# Patient Record
Sex: Male | Born: 1985 | Race: White | Hispanic: No | Marital: Single | Smoking: Never smoker
Health system: Southern US, Community
[De-identification: ages and names within clinical notes are randomized; demographics above are authoritative.]

## PROBLEM LIST (undated history)

## (undated) DIAGNOSIS — L80 Vitiligo: Secondary | ICD-10-CM

## (undated) DIAGNOSIS — R41840 Attention and concentration deficit: Secondary | ICD-10-CM

## (undated) DIAGNOSIS — F329 Major depressive disorder, single episode, unspecified: Secondary | ICD-10-CM

## (undated) HISTORY — DX: Attention and concentration deficit: R41.840

## (undated) HISTORY — DX: Vitiligo: L80

## (undated) HISTORY — PX: ADENOIDECTOMY: SUR15

## (undated) HISTORY — DX: Major depressive disorder, single episode, unspecified: F32.9

---

## 2016-04-09 ENCOUNTER — Encounter: Payer: Self-pay | Admitting: Family Medicine

## 2016-04-09 ENCOUNTER — Ambulatory Visit (INDEPENDENT_AMBULATORY_CARE_PROVIDER_SITE_OTHER): Payer: Managed Care, Other (non HMO) | Admitting: Family Medicine

## 2016-04-09 VITALS — BP 147/91 | HR 62 | Ht 75.0 in | Wt 205.0 lb

## 2016-04-09 DIAGNOSIS — F329 Major depressive disorder, single episode, unspecified: Secondary | ICD-10-CM | POA: Diagnosis not present

## 2016-04-09 DIAGNOSIS — R030 Elevated blood-pressure reading, without diagnosis of hypertension: Secondary | ICD-10-CM | POA: Insufficient documentation

## 2016-04-09 DIAGNOSIS — F909 Attention-deficit hyperactivity disorder, unspecified type: Secondary | ICD-10-CM | POA: Insufficient documentation

## 2016-04-09 DIAGNOSIS — IMO0001 Reserved for inherently not codable concepts without codable children: Secondary | ICD-10-CM

## 2016-04-09 DIAGNOSIS — F32A Depression, unspecified: Secondary | ICD-10-CM

## 2016-04-09 DIAGNOSIS — R0683 Snoring: Secondary | ICD-10-CM

## 2016-04-09 DIAGNOSIS — Z818 Family history of other mental and behavioral disorders: Secondary | ICD-10-CM | POA: Diagnosis not present

## 2016-04-09 DIAGNOSIS — R41840 Attention and concentration deficit: Secondary | ICD-10-CM

## 2016-04-09 HISTORY — DX: Depression, unspecified: F32.A

## 2016-04-09 HISTORY — DX: Attention and concentration deficit: R41.840

## 2016-04-09 NOTE — Patient Instructions (Addendum)
Thank you for coming in today. Get fasting labs soon.  Return in 1-3 months.  You should hear from ADD testing as well as Psychiatry.  You should hear from [facility] about the sleep study as well.   Call or go to the emergency room if you get worse, have trouble breathing, have chest pains, or palpitations.

## 2016-04-09 NOTE — Progress Notes (Signed)
Justin Wang is a 30 y.o. male who presents to [facility] Justin Wang: Primary Care Sports Medicine today for hypertension, depression, snoring.  Hypertension: Patient notes difficulty with attention. He notes during his entire childhood he's had difficulty attending to boring tasks. However he managed to school with good grades. 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 This is been brought to his attention by his coworkers. He notes at this point it is interfering with his ability to do his job.  Additionally patient notes depression symptoms. Over the past several years he feels little interest or pleasure in doing things, difficulty with sleep, trouble concentrating, and moving slowly. He notes these problems are very difficult. He does note significantly positive family history for bipolar disorder. His sister has been tentatively diagnosed with bipolar and it was thought that his grandmother did also have bipolar disorder. He does note at times not sleeping but not needs sleep, increased agitation and socialization, being distracted, racing thoughts, and being more confident.  Lastly he is concerned that he may have sleep apnea. He notes a strong family history for sleep apnea as well. He notes that he snores has headaches feels fatigued and has elevated blood pressure.   History reviewed. No pertinent past medical history. Past Surgical History:  Procedure Laterality Date  . ADENOIDECTOMY     Social History  Substance Use Topics  . Smoking status: Never Smoker  . Smokeless tobacco: Never Used  . Alcohol use Yes   family history includes Bipolar disorder in his paternal grandmother and sister; Diabetes in his father; Hypertension in his mother and sister; Sleep apnea in his father, mother, and sister.  ROS as above: No , visual changes,  nausea, vomiting, diarrhea, constipation, dizziness, abdominal pain, skin rash, fevers, chills, night sweats, weight loss, swollen lymph nodes, body aches, joint swelling, muscle aches, chest pain, shortness of breath,  visual or auditory hallucinations.    Medications: No current outpatient prescriptions on file.   No current facility-administered medications for this visit.    Allergies  Allergen Reactions  . Cefaclor      Exam:  BP (!) 147/91   Pulse 62   Ht 6\' 3"  (1.905 m)   Wt 205 lb (93 kg)   BMI 25.62 kg/m  Gen: Well NAD HEENT: EOMI,  MMM Lungs: Normal work of breathing. CTABL Heart: RRR no MRG Abd: NABS, Soft. Nondistended, Nontender Exts: Brisk capillary refill, warm and well perfused.  Psych: Alert and oriented normal speech thought process and affect. Mild fidgetiness noted.  Depression screen PHQ 2/9 04/09/2016  Decreased Interest 3  Down, Depressed, Hopeless 2  PHQ - 2 Score 5  Altered sleeping 3  Tired, decreased energy 2  Change in appetite 0  Feeling bad or failure about yourself  0  Trouble concentrating 3  Moving slowly or fidgety/restless 3  Suicidal thoughts 0  PHQ-9 Score 16  Difficult doing work/chores Very difficult    GAD 7 : Generalized Anxiety Score 04/09/2016  Nervous, Anxious, on Edge 1  Control/stop worrying 1  Worry too much - different things 2  Trouble relaxing 1  Restless 0  Easily annoyed or irritable 2  Afraid - awful might happen 0  Total GAD 7 Score 7  Anxiety Difficulty Very difficult    Mood disorder questionnaire positive 9 items  STOP BANG: Snore:     Yes Tired:     Yes Observed stop  breathing:  No Hypertension:   Yes  BMI >35:   No Age >50:   No Neck > 16 inches:  Yes  Male gender:   Yes ------------------------------------------ Total:     5/8   No results found for this or any previous visit (from the past 24 hour(s)). No results found.    Assessment and Plan: 30 y.o. male with   1) Poor  attention: Concerning for ADHD based on questionnaire and history. However there appears to be coexisting depression or bipolar depression. Plan to refer to [facility] for more formal testing and possible treatment options.  2) depression symptoms: Patient certainly has a positive PHQ9 however he also has a positive mood disorder questionnaire as well as a family history for bipolar depression. I'm concerned about the possibility of worsening his symptoms by starting medications today. Refer to psychiatry for more formal testing and treatment options.  3) possible sleep apnea: Patient has a strong family history of sleep apnea and screens positive with a stop bang questionnaire today. Plan refer for home sleep study.  4) elevated blood pressure: We'll recheck blood pressure in one to 3 months and check basic fasting labs today. I suspect sleep apnea may be causing elevated blood pressure.   Orders Placed This Encounter  Procedures  . CBC  . Comprehensive metabolic panel    Order Specific Question:   Has the patient fasted?    Answer:   No  . Lipid panel    Order Specific Question:   Has the patient fasted?    Answer:   No  . Hemoglobin A1c  . TSH  . VITAMIN D 25 Hydroxy (Vit-D Deficiency, Fractures)  . Ambulatory referral to Psychology    Referral Priority:   Routine    Referral Type:   Psychiatric    Referral Reason:   Specialty Services Required    Requested Specialty:   Psychology    Number of Visits Requested:   1  . Ambulatory referral to Psychiatry    Referral Priority:   Routine    Referral Type:   Psychiatric    Referral Reason:   Specialty Services Required    Requested Specialty:   Psychiatry    Number of Visits Requested:   1  . Home sleep test    Standing Status:   Future    Standing Expiration Date:   04/09/2017    Scheduling Instructions:     [facility]     STOP BANG 5/8    Order Specific Question:   Where should this test be  performed:    Answer:   Other    Discussed warning signs or symptoms. Please see discharge instructions. Patient expresses understanding.

## 2016-04-24 LAB — COMPREHENSIVE METABOLIC PANEL
ALT: 21 U/L (ref 9–46)
AST: 31 U/L (ref 10–40)
Albumin: 4.7 g/dL (ref 3.6–5.1)
Alkaline Phosphatase: 36 U/L — ABNORMAL LOW (ref 40–115)
BILIRUBIN TOTAL: 0.4 mg/dL (ref 0.2–1.2)
BUN: 23 mg/dL (ref 7–25)
CALCIUM: 9.5 mg/dL (ref 8.6–10.3)
CO2: 26 mmol/L (ref 20–31)
Chloride: 102 mmol/L (ref 98–110)
Creat: 1.23 mg/dL (ref 0.60–1.35)
GLUCOSE: 86 mg/dL (ref 65–99)
Potassium: 4.4 mmol/L (ref 3.5–5.3)
SODIUM: 137 mmol/L (ref 135–146)
Total Protein: 6.8 g/dL (ref 6.1–8.1)

## 2016-04-24 LAB — LIPID PANEL
Cholesterol: 144 mg/dL (ref 125–200)
HDL: 56 mg/dL (ref >=40)
LDL CALC: 73 mg/dL (ref <130)
Total CHOL/HDL Ratio: 2.6 Ratio (ref <=5.0)
Triglycerides: 74 mg/dL (ref <150)
VLDL: 15 mg/dL (ref <30)

## 2016-04-24 LAB — VITAMIN D 25 HYDROXY (VIT D DEFICIENCY, FRACTURES): VIT D 25 HYDROXY: 34 ng/mL (ref 30–100)

## 2016-04-24 LAB — CBC
HEMATOCRIT: 46.6 % (ref 38.5–50.0)
Hemoglobin: 15.3 g/dL (ref 13.2–17.1)
MCH: 29.8 pg (ref 27.0–33.0)
MCHC: 32.8 g/dL (ref 32.0–36.0)
MCV: 90.8 fL (ref 80.0–100.0)
MPV: 10.5 fL (ref 7.5–12.5)
Platelets: 241 10*3/uL (ref 140–400)
RBC: 5.13 MIL/uL (ref 4.20–5.80)
RDW: 13.4 % (ref 11.0–15.0)
WBC: 5.1 10*3/uL (ref 3.8–10.8)

## 2016-04-24 LAB — TSH: TSH: 2.93 mIU/L (ref 0.40–4.50)

## 2016-04-24 LAB — HEMOGLOBIN A1C
HEMOGLOBIN A1C: 5.4 % (ref <5.7)
Mean Plasma Glucose: 108 mg/dL

## 2016-05-27 ENCOUNTER — Ambulatory Visit (INDEPENDENT_AMBULATORY_CARE_PROVIDER_SITE_OTHER): Payer: 59 | Admitting: Psychiatry

## 2016-05-27 ENCOUNTER — Encounter (HOSPITAL_COMMUNITY): Payer: Self-pay | Admitting: Psychiatry

## 2016-05-27 VITALS — BP 124/80 | HR 61 | Resp 18 | Ht 73.0 in | Wt 202.0 lb

## 2016-05-27 DIAGNOSIS — F331 Major depressive disorder, recurrent, moderate: Secondary | ICD-10-CM | POA: Diagnosis not present

## 2016-05-27 DIAGNOSIS — F411 Generalized anxiety disorder: Secondary | ICD-10-CM | POA: Diagnosis not present

## 2016-05-27 MED ORDER — ESCITALOPRAM OXALATE 10 MG PO TABS: 10.0000 mg | ORAL_TABLET | Freq: Every day | ORAL | 0 refills | Status: DC

## 2016-05-27 NOTE — Progress Notes (Signed)
Psychiatric Initial Adult Assessment   Patient Identification: Justin Wang MRN:  [id number] Date of Evaluation:  05/27/2016 Referral Source: Dr. Denyse Amass Chief Complaint:   Chief Complaint    Establish Care     Visit Diagnosis:    ICD-9-CM ICD-10-CM   1. Moderate episode of recurrent major depressive disorder (HCC) 296.32 F33.1   2. GAD (generalized anxiety disorder) 300.02 F41.1     History of Present Illness:  30 years old currently single Caucasian male referred by primary care office for management or evaluation of depression  Patient states for the last 3 years progressively more gradually isn't feeling low in motivated decreased interest in his hobbies including sailing and also social inhibition meaning not wanting to go out with friends as he has been in the past or avoiding or not responding to their phone calls or text messages. States that he has been feeling low some crying spells decreased energy he zones out and does not want to be motivated to do things once he gets back from work he does like his work and is able to function but has noticed some zoning out or decreased concentration while doing multitasking Also endorses worries, excessive at times difficult work is conscious if he has to do a one-on-one with a girl but in a group setting he says he would be fine but would just sit at the back Some sleep disturbance but overall no hopelessness to the point of suicidal or homicidal thoughts. No significant guilt but does feel decreased interest in things  No psychotic symptoms, no manic symptoms currently or in the past  Aggravating factor: breakup with girl 3 years ago. It was still an anxiety provoking relationship  Modifying factor: goes to gym, work  Associated Signs/Symptoms: Depression Symptoms:  anhedonia, difficulty concentrating, anxiety, loss of energy/fatigue, disturbed sleep, (Hypo) Manic Symptoms:  Distractibility, Anxiety Symptoms:  Excessive  Worry, Psychotic Symptoms:  denies PTSD Symptoms: NA  Past Psychiatric History: Denies   Previous Psychotropic Medications: No   Substance Abuse History in the last 12 months:  No.  Consequences of Substance Abuse: NA  Past Medical History: No past medical history on file.  Past Surgical History:  Procedure Laterality Date  . ADENOIDECTOMY      Family Psychiatric History: sister: anxiety Aunt: substance use and bipolar  Family History:  Family History  Problem Relation Age of Onset  . Hypertension Mother   . Sleep apnea Mother   . Diabetes Father   . Sleep apnea Father   . Hypertension Sister   . Sleep apnea Sister   . Bipolar disorder Sister   . Bipolar disorder Paternal Grandmother     Social History:   Social History   Social History  . Marital status: Single    Spouse name: N/A  . Number of children: N/A  . Years of education: N/A   Social History Main Topics  . Smoking status: Never Smoker  . Smokeless tobacco: Never Used  . Alcohol use 1.8 - 2.4 oz/week    3 - 4 Cans of beer per week  . Drug use: No  . Sexual activity: Yes    Partners: Female    Birth control/ protection: Condom   Other Topics Concern  . None   Social History Narrative  . None    Additional Social History: Grew up with his parents he has siblings there was no trauma or physical sexual abuse he described his growing up to be good like to school he had  few friends he was active in sports. Not married, no kids  Allergies:   Allergies  Allergen Reactions  . Cefaclor     Metabolic Disorder Labs: Lab Results  Component Value Date   HGBA1C 5.4 04/23/2016   MPG 108 04/23/2016   No results found for: PROLACTIN Lab Results  Component Value Date   CHOL 144 04/23/2016   TRIG 74 04/23/2016   HDL 56 04/23/2016   CHOLHDL 2.6 04/23/2016   VLDL 15 04/23/2016   LDLCALC 73 04/23/2016     Current Medications: Current Outpatient Prescriptions  Medication Sig Dispense Refill   . escitalopram (LEXAPRO) 10 MG tablet Take 1 tablet (10 mg total) by mouth daily. 30 tablet 0   No current facility-administered medications for this visit.     Neurologic: Headache: No Seizure: No Paresthesias:No  Musculoskeletal: Strength & Muscle Tone: within normal limits Gait & Station: normal Patient leans: no lean  Psychiatric Specialty Exam: Review of Systems  Cardiovascular: Negative for chest pain.  Skin: Negative for rash.  Psychiatric/Behavioral: Positive for depression. Negative for suicidal ideas.    Blood pressure 124/80, pulse 61, resp. rate 18, height 6\' 1"  (1.854 m), weight 202 lb (91.6 kg), SpO2 98 %.Body mass index is 26.65 kg/m.  General Appearance: Casual  Eye Contact:  Fair  Speech:  Slow  Volume:  Decreased  Mood:  Dysphoric  Affect:  Congruent and Constricted  Thought Process:  Goal Directed  Orientation:  Full (Time, Place, and Person)  Thought Content:  Rumination  Suicidal Thoughts:  No  Homicidal Thoughts:  No  Memory:  Immediate;   Fair Recent;   Fair  Judgement:  Fair  Insight:  Fair  Psychomotor Activity:  Normal  Concentration:  Concentration: Fair and Attention Span: Fair  Recall:  [employer]  Fund of Knowledge:Fair  Language: Fair  Akathisia:  No  Handed:  Right  AIMS (if indicated):    Assets:  Desire for Improvement  ADL's:  Intact  Cognition: WNL  Sleep:  Fair to variable    Treatment Plan Summary: Medication management and Plan as follows  Depression possible dysthymia or major depression recurrent, mild to moderate: We'll start Lexapro 5 mg increase Intuniv and discussed various side effects If needed we continued the Wellbutrin is considering his low energy level but since he does have anxiety Lexapro may be a better choice GAD: lexapro Reviewed sleep hygiene Patient is a non smoker Denies drug use. Labs reviewed including basics and TSH are normal More than 50% time spent in counseling and coordination of care  including patient education Follow-up in 3-4 weeks or earlier if there is call 911 or report of the emergency room for any urgent concerns of suicidal thoughts   Thresa RossAKHTAR, Gayathri Futrell, MD 9/14/20172:25 PM

## 2016-06-15 ENCOUNTER — Ambulatory Visit (INDEPENDENT_AMBULATORY_CARE_PROVIDER_SITE_OTHER): Payer: 59 | Admitting: Psychiatry

## 2016-06-15 ENCOUNTER — Encounter (HOSPITAL_COMMUNITY): Payer: Self-pay | Admitting: Psychiatry

## 2016-06-15 VITALS — BP 116/64 | HR 63 | Ht 73.0 in | Wt 200.0 lb

## 2016-06-15 DIAGNOSIS — F900 Attention-deficit hyperactivity disorder, predominantly inattentive type: Secondary | ICD-10-CM | POA: Diagnosis not present

## 2016-06-15 DIAGNOSIS — F411 Generalized anxiety disorder: Secondary | ICD-10-CM | POA: Diagnosis not present

## 2016-06-15 DIAGNOSIS — F331 Major depressive disorder, recurrent, moderate: Secondary | ICD-10-CM | POA: Diagnosis not present

## 2016-06-15 MED ORDER — ESCITALOPRAM OXALATE 10 MG PO TABS: 15.0000 mg | ORAL_TABLET | Freq: Every day | ORAL | 0 refills | Status: DC

## 2016-06-15 NOTE — Progress Notes (Signed)
[facility]   Patient Identification: Justin GustKenneth Wang MRN:  [id number] Date of Evaluation:  06/15/2016 Referral Source: Dr. Denyse Amassorey Chief Complaint:   Chief Complaint    Follow-up     Visit Diagnosis:    ICD-9-CM ICD-10-CM   1. Moderate episode of recurrent major depressive disorder (HCC) 296.32 F33.1   2. GAD (generalized anxiety disorder) 300.02 F41.1   3. 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 Is not noticed significant improvement. Last visit we added Lexapro for depression he has noticed some improvement some improvement in being during group but still feels blah at times and not motivated   No psychotic symptoms, no manic symptoms currently or in the past  Aggravating factor: breakup with girl 3 years ago. It was still an anxiety provoking relationship  Modifying factor: goes to gym, work  Associated Signs/Symptoms: Depression Symptoms:  Low energy and fatigue (Hypo) Manic Symptoms:  Distractibility, Anxiety Symptoms:  Excessive Worry, ( somewhat improved ) Psychotic Symptoms:  denies PTSD Symptoms: NA  Past Psychiatric History: Denies   Previous Psychotropic Medications: No     Past Medical History: History reviewed. No pertinent past medical history.  Past Surgical History:  Procedure Laterality Date  . ADENOIDECTOMY      Family Psychiatric History: sister: anxiety Aunt: substance use and bipolar  Family History:  Family History  Problem Relation Age of Onset  . Hypertension Mother   . Sleep apnea Mother   . Diabetes Father   . Sleep apnea Father   . Hypertension Sister    . Sleep apnea Sister   . Bipolar disorder Sister   . Bipolar disorder Paternal Grandmother     Social History:   Social History   Social History  . Marital status: Single    Spouse name: N/A  . Number of children: N/A  . Years of education: N/A   Social History Main Topics  . Smoking status: Never Smoker  . Smokeless tobacco: Never Used  . Alcohol use 1.8 - 2.4 oz/week    3 - 4 Cans of beer per week  . Drug use: No  . Sexual activity: Yes    Partners: Female    Birth control/ protection: Condom   Other Topics Concern  . None   Social History Narrative  . None    Allergies:   Allergies  Allergen Reactions  . Cefaclor     Metabolic Disorder Labs: Lab Results  Component Value Date   HGBA1C 5.4 04/23/2016   MPG 108 04/23/2016   No results found for: PROLACTIN Lab Results  Component Value Date   CHOL 144 04/23/2016   TRIG 74 04/23/2016   HDL 56 04/23/2016   CHOLHDL 2.6 04/23/2016   VLDL 15 04/23/2016   LDLCALC 73 04/23/2016     Current Medications: Current Outpatient Prescriptions  Medication Sig Dispense Refill  . escitalopram (LEXAPRO) 10 MG tablet Take 1 tablet (10 mg total) by mouth daily. 30 tablet 0  . VYVANSE 20 MG capsule Take 20 mg by mouth daily.  0   No current facility-administered medications for this visit.     Neurologic: Headache: No Seizure: No Paresthesias:No  Musculoskeletal: Strength & Muscle Tone: within normal limits Gait & Station: normal Patient leans: no lean  Psychiatric Specialty Exam: Review of Systems  Cardiovascular: Negative for chest pain.  Skin: Negative for rash.  Psychiatric/Behavioral: Positive for depression. Negative for suicidal ideas.    Blood pressure 116/64, pulse 63, height 6\' 1"  (1.854 m), weight 200 lb (90.7 kg), SpO2 98 %.Body mass index is 26.39 kg/m.  General Appearance: Casual  Eye Contact:  Fair  Speech:  Slow  Volume:  Decreased  Mood:  Less dysphoric  Affect:  Congruent and  Constricted  Thought Process:  Goal Directed  Orientation:  Full (Time, Place, and Person)  Thought Content:  Rumination  Suicidal Thoughts:  No  Homicidal Thoughts:  No  Memory:  Immediate;   Fair Recent;   Fair  Judgement:  Fair  Insight:  Fair  Psychomotor Activity:  Normal  Concentration:  Concentration: Fair and Attention Span: Fair  Recall:  [employer] of Knowledge:Fair  Language: Fair  Akathisia:  No  Handed:  Right  AIMS (if indicated):    Assets:  Desire for Improvement  ADL's:  Intact  Cognition: WNL  Sleep:  Fair to variable    Treatment Plan Summary: Medication management and Plan as follows  Depression possible dysthymia or major depression recurrent, mild to moderate: We'll increase  lexapro 15mg   Consider wellbutrin if needed  Adhd: diagnosed with [place] attention center. On vyvanse now GAD: lexapro Reviewed sleep hygiene Patient is a non smoker Denies drug use. Labs reviewed including basics and TSH are normal More than 50% time spent in counseling and coordination of care including patient education Follow-up in 4 weeks or earlier if there is call 911 or report of the emergency room for any urgent concerns of suicidal thoughts Time spent: 25 minutes  Tori Dattilio, MD 10/3/201711:17 AM

## 2016-07-17 ENCOUNTER — Other Ambulatory Visit (HOSPITAL_COMMUNITY): Payer: Self-pay | Admitting: Psychiatry

## 2016-07-20 ENCOUNTER — Ambulatory Visit (HOSPITAL_COMMUNITY): Payer: Self-pay | Admitting: Psychiatry

## 2016-07-20 NOTE — Telephone Encounter (Signed)
Received faxed from [facility] requesting a refill for Lexapro. Per Dr. Gilmore LarocheAkhtar, refill is authorized for Lexapro 10mg , #45. Rx was sent to pharmacy. Pt f.u apt is schedule for 11/10. Called and  informed pt of refill status. Pt verbalizes understanding.

## 2016-07-23 ENCOUNTER — Encounter (HOSPITAL_COMMUNITY): Payer: Self-pay | Admitting: Psychiatry

## 2016-07-23 ENCOUNTER — Ambulatory Visit (INDEPENDENT_AMBULATORY_CARE_PROVIDER_SITE_OTHER): Payer: 59 | Admitting: Psychiatry

## 2016-07-23 VITALS — BP 124/70 | HR 68 | Resp 16 | Ht 73.0 in | Wt 208.0 lb

## 2016-07-23 DIAGNOSIS — Z8249 Family history of ischemic heart disease and other diseases of the circulatory system: Secondary | ICD-10-CM

## 2016-07-23 DIAGNOSIS — Z79899 Other long term (current) drug therapy: Secondary | ICD-10-CM

## 2016-07-23 DIAGNOSIS — F331 Major depressive disorder, recurrent, moderate: Secondary | ICD-10-CM | POA: Diagnosis not present

## 2016-07-23 DIAGNOSIS — Z833 Family history of diabetes mellitus: Secondary | ICD-10-CM

## 2016-07-23 DIAGNOSIS — Z8489 Family history of other specified conditions: Secondary | ICD-10-CM

## 2016-07-23 DIAGNOSIS — F411 Generalized anxiety disorder: Secondary | ICD-10-CM

## 2016-07-23 DIAGNOSIS — F900 Attention-deficit hyperactivity disorder, predominantly inattentive type: Secondary | ICD-10-CM

## 2016-07-23 DIAGNOSIS — Z818 Family history of other mental and behavioral disorders: Secondary | ICD-10-CM

## 2016-07-23 MED ORDER — ESCITALOPRAM OXALATE 10 MG PO TABS: ORAL_TABLET | ORAL | 0 refills | Status: DC

## 2016-07-23 NOTE — Progress Notes (Signed)
[facility]   Patient Identification: Beverly GustKenneth Been MRN:  [id number] Date of Evaluation:  07/23/2016 Referral Source: Dr. Denyse Amassorey Chief Complaint:   Chief Complaint    Follow-up     Visit Diagnosis:    ICD-9-CM ICD-10-CM   1. Moderate episode of recurrent major depressive disorder (HCC) 296.32 F33.1   2. GAD (generalized anxiety disorder) 300.02 F41.1   3. 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   [facility] is now [facility] and [facility] done is on Vyvanse 40 mg.  His inattention is improved.    Depression: we increased lexapro to 15mg  last visit that has helped. Better energy and motivation.  No psychotic symptoms, no manic symptoms currently or in the past  Aggravating factor: breakup with girl 3 years ago. It was still an anxiety provoking relationship  Modifying factor: goes to gym, work  Associated Signs/Symptoms: Depression Symptoms:  Energy improved. Less sad (Hypo) Manic Symptoms:  Distractibility, Anxiety Symptoms:  Excessive Worry, (improved ) Psychotic Symptoms:  denies PTSD Symptoms: NA  Past Psychiatric History: Denies   Previous Psychotropic Medications: No     Past Medical History: History reviewed. No pertinent past medical history.  Past Surgical History:  Procedure Laterality Date  . ADENOIDECTOMY      Family Psychiatric History: sister: anxiety Aunt: substance use and bipolar  Family History:  Family History  Problem Relation Age of Onset  . Hypertension Mother   . Sleep apnea Mother   . Diabetes Father   . Sleep apnea Father   . Hypertension Sister   . Sleep apnea Sister   . Bipolar disorder Sister   . Bipolar  disorder Paternal Grandmother     Social History:   Social History   Social History  . Marital status: Single    Spouse name: N/A  . Number of children: N/A  . Years of education: N/A   Social History Main Topics  . Smoking status: Never Smoker  . Smokeless tobacco: Never Used  . Alcohol use 0.6 - 1.2 oz/week    1 - 2 Cans of beer per week     Comment: weekends  . Drug use: No  . Sexual activity: Yes    Partners: Female    Birth control/ protection: Condom   Other Topics Concern  . None   Social History Narrative  . None    Allergies:   Allergies  Allergen Reactions  . Cefaclor     Metabolic Disorder Labs: Lab Results  Component Value Date   HGBA1C 5.4 04/23/2016   MPG 108 04/23/2016   No results found for: PROLACTIN Lab Results  Component Value Date   CHOL 144 04/23/2016   TRIG 74 04/23/2016   HDL 56 04/23/2016   CHOLHDL 2.6 04/23/2016   VLDL 15 04/23/2016   LDLCALC 73 04/23/2016     Current Medications: Current Outpatient Prescriptions  Medication Sig Dispense Refill  . escitalopram (LEXAPRO) 10 MG tablet TAKE 1.5 TABLETS BY MOUTH DAILY This is a 90 day supply.  Delete the prior 30 day prescription sent. 135 tablet 0  . VYVANSE 40 MG capsule Take 40 mg by mouth daily.   0   No current facility-administered medications for this visit.  Neurologic: Headache: No Seizure: No Paresthesias:No  Musculoskeletal: Strength & Muscle Tone: within normal limits Gait & Station: normal Patient leans: no lean  Psychiatric Specialty Exam: Review of Systems  Cardiovascular: Negative for palpitations.  Skin: Negative for rash.  Psychiatric/Behavioral: Negative for depression and suicidal ideas.    Blood pressure 124/70, pulse 68, resp. rate 16, height 6\' 1"  (1.854 m), weight 208 lb (94.3 kg), SpO2 98 %.Body mass index is 27.44 kg/m.  General Appearance: Casual  Eye Contact:  Fair  Speech:  Slow  Volume:  normal  Mood:  euthymic  Affect:   reactive  Thought Process:  Goal Directed  Orientation:  Full (Time, Place, and Person)  Thought Content:  Rumination  Suicidal Thoughts:  No  Homicidal Thoughts:  No  Memory:  Immediate;   Fair Recent;   Fair  Judgement:  Fair  Insight:  Fair  Psychomotor Activity:  Normal  Concentration:  Concentration: Fair and Attention Span: Fair  Recall:  [employer]Fair  Fund of Knowledge:Fair  Language: Fair  Akathisia:  No  Handed:  Right  AIMS (if indicated):    Assets:  Desire for Improvement  ADL's:  Intact  Cognition: WNL  Sleep:  Fair to variable    Treatment Plan Summary: Medication management and Plan as follows  Depression possible dysthymia or major depression recurrent, mild to moderate: Continue lexapro 15mg . Sent 90 day prescription.  Adhd: diagnosed with [place] attention center. On vyvanse now 40mg . Wants to shift this office for prescription by next month or visit.  GAD: lexapro as above. Not worsened.  Reviewed sleep hygiene Patient is a non smoker Denies drug use. Labs reviewed including basics and TSH are normal More than 50% time spent in counseling and coordination of care including patient education Follow-up in 2-3 months or earlier if there is call 911 or report of the emergency room for any urgent concerns of suicidal thoughts Time spent: 25 minutes  Thresa RossAKHTAR, Darianne Muralles, MD 11/10/201712:04 PM

## 2016-10-22 ENCOUNTER — Ambulatory Visit (HOSPITAL_COMMUNITY): Payer: Self-pay | Admitting: Psychiatry

## 2016-11-11 ENCOUNTER — Encounter: Payer: Self-pay | Admitting: Family Medicine

## 2016-11-11 ENCOUNTER — Ambulatory Visit (INDEPENDENT_AMBULATORY_CARE_PROVIDER_SITE_OTHER): Payer: Managed Care, Other (non HMO) | Admitting: Family Medicine

## 2016-11-11 VITALS — BP 134/69 | HR 64 | Wt 217.0 lb

## 2016-11-11 DIAGNOSIS — R030 Elevated blood-pressure reading, without diagnosis of hypertension: Secondary | ICD-10-CM

## 2016-11-11 DIAGNOSIS — Z23 Encounter for immunization: Secondary | ICD-10-CM

## 2016-11-11 DIAGNOSIS — Z0000 Encounter for general adult medical examination without abnormal findings: Secondary | ICD-10-CM

## 2016-11-11 DIAGNOSIS — F325 Major depressive disorder, single episode, in full remission: Secondary | ICD-10-CM | POA: Diagnosis not present

## 2016-11-11 DIAGNOSIS — R0683 Snoring: Secondary | ICD-10-CM | POA: Diagnosis not present

## 2016-11-11 DIAGNOSIS — L80 Vitiligo: Secondary | ICD-10-CM

## 2016-11-11 DIAGNOSIS — N342 Other urethritis: Secondary | ICD-10-CM

## 2016-11-11 DIAGNOSIS — R41840 Attention and concentration deficit: Secondary | ICD-10-CM

## 2016-11-11 DIAGNOSIS — Z8041 Family history of malignant neoplasm of ovary: Secondary | ICD-10-CM

## 2016-11-11 HISTORY — DX: Vitiligo: L80

## 2016-11-11 LAB — CBC
HEMATOCRIT: 42 % (ref 38.5–50.0)
HEMOGLOBIN: 14.3 g/dL (ref 13.2–17.1)
MCH: 30.7 pg (ref 27.0–33.0)
MCHC: 34 g/dL (ref 32.0–36.0)
MCV: 90.1 fL (ref 80.0–100.0)
MPV: 9.6 fL (ref 7.5–12.5)
Platelets: 260 10*3/uL (ref 140–400)
RBC: 4.66 MIL/uL (ref 4.20–5.80)
RDW: 13.6 % (ref 11.0–15.0)
WBC: 5.6 10*3/uL (ref 3.8–10.8)

## 2016-11-11 MED ORDER — LISDEXAMFETAMINE DIMESYLATE 40 MG PO CAPS: 40.0000 mg | ORAL_CAPSULE | Freq: Every day | ORAL | 0 refills | Status: DC

## 2016-11-11 MED ORDER — ESCITALOPRAM OXALATE 10 MG PO TABS: 10.0000 mg | ORAL_TABLET | Freq: Every day | ORAL | 3 refills | Status: DC

## 2016-11-11 MED ORDER — LISDEXAMFETAMINE DIMESYLATE 40 MG PO CAPS: 40.0000 mg | ORAL_CAPSULE | ORAL | 0 refills | Status: DC

## 2016-11-11 NOTE — Patient Instructions (Signed)
Thank you for coming in today. I recommend a sleep study.   You should hear from [facility] soon.  Let me know if you do not hear anything.   Continue Vyvanse and Lexapro.  I am taking over prescribing.  Recheck in 3 months.   You should hear from me soon about urine tests.    Urethritis, Adult Urethritis is an inflammation of the tube through which urine exits your bladder (urethra). What are the causes? Urethritis is often caused by an infection in your urethra. The infection can be viral, like herpes. The infection can also be bacterial, like gonorrhea. What increases the risk? Risk factors of urethritis include:  Having sex without using a condom.  Having multiple sexual partners.  Having poor hygiene. What are the signs or symptoms? Symptoms of urethritis are less noticeable in women than in men. These symptoms include:  Burning feeling when you urinate (dysuria).  Discharge from your urethra.  Blood in your urine (hematuria).  Urinating more than usual. How is this diagnosed? To confirm a diagnosis of urethritis, your health care provider will do the following:  Ask about your sexual history.  Perform a physical exam.  Have you provide a sample of your urine for lab testing.  Use a cotton swab to gently collect a sample from your urethra for lab testing. How is this treated? It is important to treat urethritis. Depending on the cause, untreated urethritis may lead to serious genital infections and possibly infertility. Urethritis caused by a bacterial infection is treated with antibiotic medicine. All sexual partners must be treated. Follow these instructions at home:  Do not have sex until the test results are known and treatment is completed, even if your symptoms go away before you finish treatment.  If you were prescribed an antibiotic, finish it all even if you start to feel better. Contact a health care provider if:  Your symptoms are not  improved in 3 days.  Your symptoms are getting worse.  You develop abdominal pain or pelvic pain (in women).  You develop joint pain.  You have a fever. Get help right away if:  You have severe pain in the belly, back, or side.  You have repeated vomiting. This information is not intended to replace advice given to you by your health care provider. Make sure you discuss any questions you have with your health care provider. Document Released: 02/23/2001 Document Revised: 02/05/2016 Document Reviewed: 04/30/2013 Elsevier Interactive Patient Education  2017 [employer]

## 2016-11-11 NOTE — Progress Notes (Signed)
Justin Wang is a 31 y.o. male who [facility]: Primary Care Sports Medicine today for well adult and discuss penile discharge.   Well Adult: Patient is doing well. He tries to exercise regularly and tries eat a healthy diet. He notes increased family and life stress recently. His mother was recently diagnosed with ovarian cancer. He does not have a regular partner but is sexually active. He has sex with women and uses condoms regularly. He feels well.  Penile discharge: Patient notes clear occasional penile discharge over the last several weeks to months. He denies significant pain or irritation fevers or chills.  ADHD: Patient is doing well Vyvanse. He has previously been [facility]my [facility]. He is on a stable dose of 40 mg of Vyvanse daily. He would like to transition his care to this location for convenience and cost.  Depression: Patient has a history of depression and currently is taking Lexapro 15 mg daily. He notes he is doing well would like to transition his care to primary care if possible. He would like to decrease his Lexapro dose to 10 mg as he thinks the 15 mg causes a headache.   Past Medical History:  Diagnosis Date  . Vitiligo 11/11/2016   Past Surgical History:  Procedure Laterality Date  . ADENOIDECTOMY     Social History  Substance Use Topics  . Smoking status: Never Smoker  . Smokeless tobacco: Never Used  . Alcohol use 0.6 - 1.2 oz/week    1 - 2 Cans of beer per week     Comment: weekends   family history includes Bipolar disorder in his paternal grandmother and sister; Diabetes in his father; Hypertension in his mother and sister; Ovarian cancer in his mother; Sleep apnea in his father, mother, and sister.  ROS as above:  Medications: Current Outpatient Prescriptions  Medication Sig Dispense Refill  . escitalopram  (LEXAPRO) 10 MG tablet Take 1 tablet (10 mg total) by mouth daily. 90 tablet 3  . lisdexamfetamine (VYVANSE) 40 MG capsule Take 1 capsule (40 mg total) by mouth daily. 30 capsule 0  . lisdexamfetamine (VYVANSE) 40 MG capsule Take 1 capsule (40 mg total) by mouth every morning. Fill in 30 days 30 capsule 0   No current facility-administered medications for this visit.    Allergies  Allergen Reactions  . Cefaclor     Health Maintenance Health Maintenance  Topic Date Due  . INFLUENZA VACCINE  05/14/2017 (Originally 04/13/2016)  . TETANUS/TDAP  11/12/2026  . HIV Screening  Completed     Exam:  BP 134/69   Pulse 64   Wt 217 lb (98.4 kg)   BMI 28.63 kg/m  Gen: Well NAD HEENT: EOMI,  MMM Lungs: Normal work of breathing. CTABL Heart: RRR no MRG Abd: NABS, Soft. Nondistended, Nontender Exts: Brisk capillary refill, warm and well perfused.  Genitals: No inguinal lymphadenopathy. Penis is circumcised and nontender with no significant discharge. Testicles are descended bilaterally with no masses Psych: Alert and oriented normal speech thought process and affect.  GAD 7 : Generalized Anxiety Score 11/11/2016 04/09/2016  Nervous, Anxious, on Edge 0 1  Control/stop worrying 0 1  Worry too much - different things 1 2  Trouble relaxing 0 1  Restless 0 0  Easily annoyed or irritable 0 2  Afraid - awful might happen 0 0  Total GAD 7 Score 1 7  Anxiety Difficulty Not difficult at  all Very difficult    Depression screen [facility] 2/9 11/11/2016 04/09/2016  Decreased Interest 0 3  Down, Depressed, Hopeless 0 2  PHQ - 2 Score 0 5  Altered sleeping 1 3  Tired, decreased energy 0 2  Change in appetite 0 0  Feeling bad or failure about yourself  0 0  Trouble concentrating 0 3  Moving slowly or fidgety/restless 1 3  Suicidal thoughts 0 0  PHQ-9 Score 2 16  Difficult doing work/chores - Very difficult      Assessment and Plan: 31 y.o. male with  Will adult. Doing reasonably well. Will obtain  basic labs listed below. Patient is it's increased risk for sleep apnea based on family history, snoring history and elevated blood pressure. We'll obtain a home sleep study that was ordered but not completed about half a year ago.  Penile discharge: Unclear etiology. Likely normal discharge however will obtain GC Chlamydia and urine culture.  ADHD: Doing well. Will take over prescribing Vyvanse. Letter written to Dr. Elisabeth Wang. Recheck in 3 months.  Depression: Doing well. Will take over prescribing Lexapro.  Basic fasting labs listed below.  Tdap given prior to discharge.  Influenza vaccine declined.   Orders Placed This Encounter  Procedures  . GC/Chlamydia Probe Amp  . Urine culture  . Tdap vaccine greater than or equal to 7yo IM  . CBC  . COMPLETE METABOLIC PANEL WITH GFR  . T3, free  . T4, free  . TSH  . HIV antibody  . RPR  . Home sleep test    Standing Status:   Future    Standing Expiration Date:   11/11/2017    Scheduling Instructions:     [facility]    Order Specific Question:   Where should this test be performed:    Answer:   Other   Meds ordered this encounter  Medications  . lisdexamfetamine (VYVANSE) 40 MG capsule    Sig: Take 1 capsule (40 mg total) by mouth daily.    Dispense:  30 capsule    Refill:  0    Fill in 60 days  . lisdexamfetamine (VYVANSE) 40 MG capsule    Sig: Take 1 capsule (40 mg total) by mouth every morning. Fill in 30 days    Dispense:  30 capsule    Refill:  0  . escitalopram (LEXAPRO) 10 MG tablet    Sig: Take 1 tablet (10 mg total) by mouth daily.    Dispense:  90 tablet    Refill:  3     Discussed warning signs or symptoms. Please see discharge instructions. Patient expresses understanding.

## 2016-11-12 LAB — COMPLETE METABOLIC PANEL WITH GFR
ALBUMIN: 4.5 g/dL (ref 3.6–5.1)
ALK PHOS: 39 U/L — AB (ref 40–115)
ALT: 34 U/L (ref 9–46)
AST: 23 U/L (ref 10–40)
BUN: 21 mg/dL (ref 7–25)
CALCIUM: 9.4 mg/dL (ref 8.6–10.3)
CO2: 27 mmol/L (ref 20–31)
Chloride: 102 mmol/L (ref 98–110)
Creat: 1.14 mg/dL (ref 0.60–1.35)
GFR, EST NON AFRICAN AMERICAN: 86 mL/min (ref >=60)
GFR, Est African American: >89 mL/min (ref >=60)
GLUCOSE: 91 mg/dL (ref 65–99)
POTASSIUM: 4.2 mmol/L (ref 3.5–5.3)
SODIUM: 137 mmol/L (ref 135–146)
TOTAL PROTEIN: 6.9 g/dL (ref 6.1–8.1)
Total Bilirubin: 0.4 mg/dL (ref 0.2–1.2)

## 2016-11-12 LAB — HIV ANTIBODY (ROUTINE TESTING W REFLEX): HIV 1&2 Ab, 4th Generation: NONREACTIVE

## 2016-11-12 LAB — GC/CHLAMYDIA PROBE AMP
CT Probe RNA: NOT DETECTED
GC PROBE AMP APTIMA: NOT DETECTED

## 2016-11-12 LAB — RPR

## 2016-11-12 LAB — T3, FREE: T3 FREE: 3.5 pg/mL (ref 2.3–4.2)

## 2016-11-12 LAB — T4, FREE: Free T4: 0.9 ng/dL (ref 0.8–1.8)

## 2016-11-12 LAB — TSH: TSH: 2.68 m[IU]/L (ref 0.40–4.50)

## 2016-11-13 LAB — URINE CULTURE: Organism ID, Bacteria: NO GROWTH

## 2017-03-10 ENCOUNTER — Ambulatory Visit (INDEPENDENT_AMBULATORY_CARE_PROVIDER_SITE_OTHER): Payer: Managed Care, Other (non HMO) | Admitting: Family Medicine

## 2017-03-10 ENCOUNTER — Encounter: Payer: Self-pay | Admitting: Family Medicine

## 2017-03-10 VITALS — BP 141/75 | HR 66 | Ht 74.0 in | Wt 221.0 lb

## 2017-03-10 DIAGNOSIS — R41840 Attention and concentration deficit: Secondary | ICD-10-CM

## 2017-03-10 MED ORDER — LISDEXAMFETAMINE DIMESYLATE 40 MG PO CAPS: 40.0000 mg | ORAL_CAPSULE | ORAL | 0 refills | Status: DC

## 2017-03-10 NOTE — Patient Instructions (Signed)
Thank you for coming in today. Take vyvanse daily.  Recheck every 3 months.   Return sooner if needed.

## 2017-03-10 NOTE — Progress Notes (Signed)
       Justin Wang is a 31 y.o. male who [name]: Primary Care Sports Medicine today for follow up ADHD. Ramos take Vyvanse 40mg  daily. He notes that this medicine works well for him. He does not sweating on this medicine that he does not find it very obnoxious. He feels well and like to continue the medications.   Past Medical History:  Diagnosis Date  . Vitiligo 11/11/2016   Past Surgical History:  Procedure Laterality Date  . ADENOIDECTOMY     Social History  Substance Use Topics  . Smoking status: Never Smoker  . Smokeless tobacco: Never Used  . Alcohol use 0.6 - 1.2 oz/week    1 - 2 Cans of beer per week     Comment: weekends   family history includes Bipolar disorder in his paternal grandmother and sister; Diabetes in his father; Hypertension in his mother and sister; Ovarian cancer in his mother; Sleep apnea in his father, mother, and sister.  ROS as above:  Medications: Current Outpatient Prescriptions  Medication Sig Dispense Refill  . escitalopram (LEXAPRO) 10 MG tablet Take 1 tablet (10 mg total) by mouth daily. 90 tablet 3  . lisdexamfetamine (VYVANSE) 40 MG capsule Take 1 capsule (40 mg total) by mouth every morning. Fill in 30 days 90 capsule 0   No current facility-administered medications for this visit.    Allergies  Allergen Reactions  . Cefaclor     Health Maintenance Health Maintenance  Topic Date Due  . INFLUENZA VACCINE  05/14/2017 (Originally 04/13/2017)  . TETANUS/TDAP  11/12/2026  . HIV Screening  Completed     Exam:  BP (!) 141/75   Pulse 66   Ht 6\' 2"  (1.88 m)   Wt 221 lb (100.2 kg)   BMI 28.37 kg/m  Gen: Well NAD Psych alert and oriented normal process and affect.   No results found for this or any previous visit (from the past 72 hour(s)). No results found.    Assessment and Plan: 31 y.o. male with  ADHD. Doing well continue  Vyvanse. Continue to monitor blood pressure. Recheck in 3 months.   No orders of the defined types were placed in this encounter.  Meds ordered this encounter  Medications  . lisdexamfetamine (VYVANSE) 40 MG capsule    Sig: Take 1 capsule (40 mg total) by mouth every morning. Fill in 30 days    Dispense:  90 capsule    Refill:  0     Discussed warning signs or symptoms. Please see discharge instructions. Patient expresses understanding.

## 2017-06-09 ENCOUNTER — Ambulatory Visit: Payer: Self-pay | Admitting: Family Medicine

## 2017-06-30 ENCOUNTER — Ambulatory Visit: Payer: Self-pay | Admitting: Family Medicine

## 2017-07-13 ENCOUNTER — Ambulatory Visit (INDEPENDENT_AMBULATORY_CARE_PROVIDER_SITE_OTHER): Payer: Managed Care, Other (non HMO) | Admitting: Family Medicine

## 2017-07-13 ENCOUNTER — Encounter: Payer: Self-pay | Admitting: Family Medicine

## 2017-07-13 VITALS — BP 132/77 | HR 87 | Wt 220.0 lb

## 2017-07-13 DIAGNOSIS — R41840 Attention and concentration deficit: Secondary | ICD-10-CM

## 2017-07-13 DIAGNOSIS — R209 Unspecified disturbances of skin sensation: Secondary | ICD-10-CM

## 2017-07-13 MED ORDER — LISDEXAMFETAMINE DIMESYLATE 40 MG PO CAPS: 40.0000 mg | ORAL_CAPSULE | ORAL | 0 refills | Status: DC

## 2017-07-13 NOTE — Progress Notes (Signed)
       Justin Wang is a 31 y.o. male who [name]: Primary Care Sports Medicine today for follow-up ADHD and discuss hands and feet being cold.  [name] takes 40 mg of Vyvanse daily for this works well and allows him to function at work.  He notes some mild excessive sweating with this but overall is quite satisfied with how things are going.  He would like to continue to take the medication if possible.  He does note however his hands and feet get cold and numb at times.  This is been ongoing since before he took Vyvanse and seems to be more cold sensitive.  He feels well otherwise and denies any pain or loss of function.   Past Medical History:  Diagnosis Date  . Vitiligo 11/11/2016   Past Surgical History:  Procedure Laterality Date  . ADENOIDECTOMY     Social History  Substance Use Topics  . Smoking status: Never Smoker  . Smokeless tobacco: Never Used  . Alcohol use 0.6 - 1.2 oz/week    1 - 2 Cans of beer per week     Comment: weekends   family history includes Bipolar disorder in his paternal grandmother and sister; Diabetes in his father; Hypertension in his mother and sister; Ovarian cancer in his mother; Sleep apnea in his father, mother, and sister.  ROS as above:  Medications: Current Outpatient Prescriptions  Medication Sig Dispense Refill  . escitalopram (LEXAPRO) 10 MG tablet Take 1 tablet (10 mg total) by mouth daily. 90 tablet 3  . lisdexamfetamine (VYVANSE) 40 MG capsule Take 1 capsule (40 mg total) by mouth every morning. 90 capsule 0   No current facility-administered medications for this visit.    Allergies  Allergen Reactions  . Cefaclor     Health Maintenance Health Maintenance  Topic Date Due  . INFLUENZA VACCINE  07/13/2018 (Originally 04/13/2017)  . TETANUS/TDAP  11/12/2026  . HIV Screening  Completed     Exam:  BP 132/77   Pulse 87   Wt 220  lb (99.8 kg)   BMI 28.25 kg/m  Gen: Well NAD HEENT: EOMI,  MMM Lungs: Normal work of breathing. CTABL Heart: RRR no MRG Abd: NABS, Soft. Nondistended, Nontender Exts: Brisk capillary refill, warm and well perfused.  Feet bilaterally are normal-appearing with normal pulses capillary refill and sensation.  Sensation is intact to monofilament testing.   No results found for this or any previous visit (from the past 72 hour(s)). No results found.    Assessment and Plan: 31 y.o. male with  ADHD: Doing well.  Continue Vyvanse and recheck in 3-4 months when he runs out.  Hands and feet cold: Unclear etiology.  I do not think he has Raynauds phenomenon or any other serious issue.  Pulses capillary refill and sensation are intact. Plan for watchful waiting.  Flu vaccine declined  No orders of the defined types were placed in this encounter.  Meds ordered this encounter  Medications  . lisdexamfetamine (VYVANSE) 40 MG capsule    Sig: Take 1 capsule (40 mg total) by mouth every morning.    Dispense:  90 capsule    Refill:  0     Discussed warning signs or symptoms. Please see discharge instructions. Patient expresses understanding.

## 2017-07-13 NOTE — Patient Instructions (Addendum)
Thank you for coming in today. Continue vyvanse.  Recheck in 3-4 months.  Return before you run out.   Let me know about hands being cold.

## 2017-11-16 ENCOUNTER — Encounter: Payer: Self-pay | Admitting: Family Medicine

## 2017-11-16 ENCOUNTER — Ambulatory Visit (INDEPENDENT_AMBULATORY_CARE_PROVIDER_SITE_OTHER): Payer: Managed Care, Other (non HMO) | Admitting: Family Medicine

## 2017-11-16 VITALS — BP 133/89 | HR 71 | Ht 74.0 in | Wt 221.0 lb

## 2017-11-16 DIAGNOSIS — Z0000 Encounter for general adult medical examination without abnormal findings: Secondary | ICD-10-CM | POA: Diagnosis not present

## 2017-11-16 DIAGNOSIS — R0683 Snoring: Secondary | ICD-10-CM

## 2017-11-16 DIAGNOSIS — Z6828 Body mass index (BMI) 28.0-28.9, adult: Secondary | ICD-10-CM | POA: Diagnosis not present

## 2017-11-16 DIAGNOSIS — R41840 Attention and concentration deficit: Secondary | ICD-10-CM | POA: Diagnosis not present

## 2017-11-16 DIAGNOSIS — R030 Elevated blood-pressure reading, without diagnosis of hypertension: Secondary | ICD-10-CM | POA: Diagnosis not present

## 2017-11-16 LAB — CBC
HCT: 45.2 % (ref 38.5–50.0)
HEMOGLOBIN: 15.5 g/dL (ref 13.2–17.1)
MCH: 30.1 pg (ref 27.0–33.0)
MCHC: 34.3 g/dL (ref 32.0–36.0)
MCV: 87.8 fL (ref 80.0–100.0)
MPV: 9.8 fL (ref 7.5–12.5)
PLATELETS: 257 10*3/uL (ref 140–400)
RBC: 5.15 10*6/uL (ref 4.20–5.80)
RDW: 12.8 % (ref 11.0–15.0)
WBC: 5.3 10*3/uL (ref 3.8–10.8)

## 2017-11-16 LAB — COMPLETE METABOLIC PANEL WITH GFR
AG Ratio: 1.9 (calc) (ref 1.0–2.5)
ALKALINE PHOSPHATASE (APISO): 43 U/L (ref 40–115)
ALT: 20 U/L (ref 9–46)
AST: 23 U/L (ref 10–40)
Albumin: 4.8 g/dL (ref 3.6–5.1)
BILIRUBIN TOTAL: 0.6 mg/dL (ref 0.2–1.2)
BUN: 18 mg/dL (ref 7–25)
CHLORIDE: 101 mmol/L (ref 98–110)
CO2: 31 mmol/L (ref 20–32)
CREATININE: 1.12 mg/dL (ref 0.60–1.35)
Calcium: 9.7 mg/dL (ref 8.6–10.3)
GFR, Est African American: 101 mL/min/{1.73_m2} (ref >=60)
GFR, Est Non African American: 87 mL/min/{1.73_m2} (ref >=60)
GLOBULIN: 2.5 g/dL (ref 1.9–3.7)
Glucose, Bld: 94 mg/dL (ref 65–99)
Potassium: 4.4 mmol/L (ref 3.5–5.3)
SODIUM: 138 mmol/L (ref 135–146)
Total Protein: 7.3 g/dL (ref 6.1–8.1)

## 2017-11-16 LAB — LIPID PANEL W/REFLEX DIRECT LDL
Cholesterol: 178 mg/dL (ref <200)
HDL: 48 mg/dL (ref >40)
LDL Cholesterol (Calc): 114 mg/dL (calc) — ABNORMAL HIGH
Non-HDL Cholesterol (Calc): 130 mg/dL (calc) — ABNORMAL HIGH (ref <130)
Total CHOL/HDL Ratio: 3.7 (calc) (ref <5.0)
Triglycerides: 68 mg/dL (ref <150)

## 2017-11-16 MED ORDER — AMPHETAMINE-DEXTROAMPHET ER 20 MG PO CP24: 20.0000 mg | ORAL_CAPSULE | Freq: Every day | ORAL | 0 refills | Status: DC

## 2017-11-16 NOTE — Progress Notes (Signed)
Justin Wang is a 32 y.o. male who presents to [name]: Primary Care Sports Medicine today for well adult visit and discuss ADHD and depression.  [name] is a healthy 32 year old man who has ADHD that is currently managed with Vyvanse.  He is doing well overall and notes that he is exercising regularly and feeling pretty good about his life.  He tries to eat healthfully and get adequate sleep.  As for his ADHD he notes that his insurance will no longer pay for Vyvanse and he needs to switch to an alternative medication.  As for depression he did well after his diagnosis of depression and treatment with Lexapro.  He has successfully tapered off of Lexapro and is no longer feeling any depressive symptoms.  He notes a history of elevated blood pressure and notes that he snores and is interested in getting a sleep study that we talked about a few visits ago.   Past Medical History:  Diagnosis Date  . Attention deficit 04/09/2016  . Depression 04/09/2016  . Vitiligo 11/11/2016   Past Surgical History:  Procedure Laterality Date  . ADENOIDECTOMY     Social History   Tobacco Use  . Smoking status: Never Smoker  . Smokeless tobacco: Never Used  Substance Use Topics  . Alcohol use: Yes    Alcohol/week: 0.6 - 1.2 oz    Types: 1 - 2 Cans of beer per week    Comment: weekends   family history includes Bipolar disorder in his paternal grandmother and sister; Diabetes in his father; Hypertension in his mother and sister; Ovarian cancer in his mother; Sleep apnea in his father, mother, and sister.  ROS as above:  Medications: Current Outpatient Medications  Medication Sig Dispense Refill  . cetirizine (ZYRTEC) 10 MG tablet Take 10 mg by mouth daily.    Marland Kitchen amphetamine-dextroamphetamine (ADDERALL XR) 20 MG 24 hr capsule Take 1 capsule (20 mg total) by mouth daily. 30 capsule 0   No current  facility-administered medications for this visit.    Allergies  Allergen Reactions  . Cefaclor     Health Maintenance Health Maintenance  Topic Date Due  . INFLUENZA VACCINE  07/13/2018 (Originally 04/13/2017)  . TETANUS/TDAP  11/12/2026  . HIV Screening  Completed     Exam:  BP 133/89   Pulse 71   Ht 6\' 2"  (1.88 m)   Wt 221 lb (100.2 kg)   BMI 28.37 kg/m  Gen: Well NAD HEENT: EOMI,  MMM Lungs: Normal work of breathing. CTABL Heart: RRR no MRG Abd: NABS, Soft. Nondistended, Nontender Exts: Brisk capillary refill, warm and well perfused.  No dysplastic appearing nevi Psych alert and oriented normal speech thought process and affect.  Depression screen [facility] 2/9 11/16/2017 11/11/2016 04/09/2016  Decreased Interest 0 0 3  Down, Depressed, Hopeless 0 0 2  PHQ - 2 Score 0 0 5  Altered sleeping 0 1 3  Tired, decreased energy 0 0 2  Change in appetite 0 0 0  Feeling bad or failure about yourself  0 0 0  Trouble concentrating 0 0 3  Moving slowly or fidgety/restless 0 1 3  Suicidal thoughts 0 0 0  PHQ-9 Score 0 2 16  Difficult doing work/chores Not difficult at all - Very difficult   GAD 7 : Generalized Anxiety Score 11/16/2017 11/11/2016 04/09/2016  Nervous, Anxious, on Edge 0 0 1  Control/stop worrying 0 0 1  Worry too much - different things 0  1 2  Trouble relaxing 2 0 1  Restless 1 0 0  Easily annoyed or irritable 0 0 2  Afraid - awful might happen 0 0 0  Total GAD 7 Score 3 1 7   Anxiety Difficulty Not difficult at all Not difficult at all Very difficult      No results found for this or any previous visit (from the past 72 hour(s)). No results found.    Assessment and Plan: 32 y.o. male with well adult doing well.  Plan to check basic fasting labs listed below.  ADHD: We will switch to Adderall trial.  Patient will report back in a few weeks to let me know how he is doing.  If all is well continue for 3 months.  Check in 3 months  Depression: Resolved.  Plan for  watchful waiting off of medication.  Possible sleep apnea: Sleep study ordered.  Elevated blood pressure not diagnostic of hypertension.  Watchful waiting.  Continue exercise and keep the weight off.  Evaluate for sleep apnea.  Check metabolic panel.   Orders Placed This Encounter  Procedures  . CBC  . COMPLETE METABOLIC PANEL WITH GFR  . Lipid Panel w/reflex Direct LDL  . Home sleep test    Standing Status:   Future    Standing Expiration Date:   11/17/2018    Scheduling Instructions:     Sound sleep    Order Specific Question:   Where should this test be performed:    Answer:   Other   Meds ordered this encounter  Medications  . amphetamine-dextroamphetamine (ADDERALL XR) 20 MG 24 hr capsule    Sig: Take 1 capsule (20 mg total) by mouth daily.    Dispense:  30 capsule    Refill:  0     Discussed warning signs or symptoms. Please see discharge instructions. Patient expresses understanding.

## 2017-11-16 NOTE — Patient Instructions (Addendum)
Thank you for coming in today. Continue Zyrtec You should hear about sleep study soon.  Let me know if you don't hear anything within 1 week.  Ok to have tapered off lexapro.  Let me know if we need to restart.  Switch to Adderall Xr for insurance.  If Xr is not covered we will switch to immediate release.   Recheck with me every 3 months ad per usually.  Send me a report in 2-4 weeks through mychart about Adderall  We can adjust.   Amphetamine; Dextroamphetamine extended-release capsules What is this medicine? AMPHETAMINE; DEXTROAMPHETAMINE (am FET a meen; dex troe am FET a meen) is used to treat attention-deficit hyperactivity disorder (ADHD). Federal law prohibits giving this medicine to any person other than the person for whom it was prescribed. Do not share this medicine with anyone else. This medicine may be used for other purposes; ask your health care provider or pharmacist if you have questions. COMMON BRAND NAME(S): Adderall XR, Mydayis What should I tell my health care provider before I take this medicine? They need to know if you have any of these conditions: -anxiety or panic attacks -circulation problems in fingers and toes -glaucoma -hardening or blockages of the arteries or heart blood vessels -heart disease or a heart defect -high blood pressure -history of a drug or alcohol abuse problem -history of stroke -kidney disease -liver disease -mental illness -seizures -suicidal thoughts, plans, or attempt; a previous suicide attempt by you or a family member -thyroid disease -Tourette's syndrome -an unusual or allergic reaction to dextroamphetamine, other amphetamines, other medicines, foods, dyes, or preservatives -pregnant or trying to get pregnant -breast-feeding How should I use this medicine? Take this medicine by mouth with a glass of water. Follow the directions on the prescription label. This medicine is taken just one time per day, usually in the morning  after waking up. Take with or without food. Do not chew or crush this medicine. You may open the capsules and sprinkle the medicine on a spoonful of applesauce. If sprinkled on applesauce, take the dose immediately and do not crush or chew. Always drink a glass of water or other liquid after taking this medicine. Do not take your medicine more often than directed. A special MedGuide will be given to you by the pharmacist with each prescription and refill. Be sure to read this information carefully each time. Talk to your pediatrician regarding the use of this medicine in children. While this drug may be prescribed for children as young as 6 years for selected conditions, precautions do apply. Overdosage: If you think you have taken too much of this medicine contact a poison control center or emergency room at once. NOTE: This medicine is only for you. Do not share this medicine with others. What if I miss a dose? If you miss a dose, take it as soon as you can. If it is almost time for your next dose, take only that dose. Do not take double or extra doses. What may interact with this medicine? Do not take this medicine with any of the following medications: -MAOIs like Carbex, Eldepryl, Marplan, Nardil, and Parnate -other stimulant medicines for attention disorders, weight loss, or to stay awake This medicine may also interact with the following medications: -acetazolamide -ammonium chloride -antacids -ascorbic acid -atomoxetine -caffeine -certain medicines for blood pressure -certain medicines for depression, anxiety, or psychotic disturbances -certain medicines for diabetes -certain medicines for seizures like carbamazepine, phenobarbital, phenytoin -certain medicines for stomach problems like  cimetidine, famotidine, omeprazole, lansoprazole -cold or allergy medicines -glutamic acid -lithium -meperidine -methenamine; sodium acid phosphate -narcotic medicines for  pain -norepinephrine -phenothiazines like chlorpromazine, mesoridazine, prochlorperazine, thioridazine -sodium bicarbonate This list may not describe all possible interactions. Give your health care provider a list of all the medicines, herbs, non-prescription drugs, or dietary supplements you use. Also tell them if you smoke, drink alcohol, or use illegal drugs. Some items may interact with your medicine. What should I watch for while using this medicine? Visit your doctor or health care professional for regular checks on your progress. This prescription requires that you follow special procedures with your doctor and pharmacy. You will need to have a new written prescription from your doctor every time you need a refill. This medicine may affect your concentration, or hide signs of tiredness. Until you know how this medicine affects you, do not drive, ride a bicycle, use machinery, or do anything that needs mental alertness. Tell your doctor or health care professional if this medicine loses its effects, or if you feel you need to take more than the prescribed amount. Do not change the dosage without talking to your doctor or health care professional. Decreased appetite is a common side effect when starting this medicine. Eating small, frequent meals or snacks can help. Talk to your doctor if you continue to have poor eating habits. Height and weight growth of a child taking this medicine will be monitored closely. Do not take this medicine close to bedtime. It may prevent you from sleeping. If you are going to need surgery, an MRI, a CT scan, or other procedure, tell your doctor that you are taking this medicine. You may need to stop taking this medicine before the procedure. Tell your doctor or healthcare professional right away if you notice unexplained wounds on your fingers and toes while taking this medicine. You should also tell your healthcare provider if you experience numbness or pain,  changes in the skin color, or sensitivity to temperature in your fingers or toes. What side effects may I notice from receiving this medicine? Side effects that you should report to your doctor or health care professional as soon as possible: -allergic reactions like skin rash, itching or hives, swelling of the face, lips, or tongue -changes in vision -chest pain or chest tightness -confusion, trouble speaking or understanding -fast, irregular heartbeat -fingers or toes feel numb, cool, painful -hallucination, loss of contact with reality -high blood pressure -males: prolonged or painful erection -seizures -severe headaches -shortness of breath -suicidal thoughts or other mood changes -trouble walking, dizziness, loss of balance or coordination -uncontrollable head, mouth, neck, arm, or leg movements Side effects that usually do not require medical attention (report to your doctor or health care professional if they continue or are bothersome): -anxious -headache -loss of appetite -nausea, vomiting -trouble sleeping -weight loss This list may not describe all possible side effects. Call your doctor for medical advice about side effects. You may report side effects to FDA at 1-800-FDA-1088. Where should I keep my medicine? Keep out of the reach of children. This medicine can be abused. Keep your medicine in a safe place to protect it from theft. Do not share this medicine with anyone. Selling or giving away this medicine is dangerous and against the law. Store at room temperature between 15 and 30 degrees C (59 and 86 degrees F). Keep container tightly closed. Protect from light. Throw away any unused medicine after the expiration date. NOTE: This sheet is a  summary. It may not cover all possible information. If you have questions about this medicine, talk to your doctor, pharmacist, or health care provider.  2018 Elsevier/Gold Standard (2014-07-03 18:22:45)

## 2017-11-20 ENCOUNTER — Other Ambulatory Visit: Payer: Self-pay | Admitting: Family Medicine

## 2018-02-22 ENCOUNTER — Ambulatory Visit: Payer: Self-pay | Admitting: Family Medicine

## 2019-04-19 ENCOUNTER — Encounter: Payer: Self-pay | Admitting: Family Medicine

## 2019-04-19 ENCOUNTER — Other Ambulatory Visit: Payer: Self-pay

## 2019-04-19 ENCOUNTER — Ambulatory Visit (INDEPENDENT_AMBULATORY_CARE_PROVIDER_SITE_OTHER): Payer: Managed Care, Other (non HMO) | Admitting: Family Medicine

## 2019-04-19 VITALS — BP 125/85 | HR 63 | Temp 97.7°F | Wt 222.0 lb

## 2019-04-19 DIAGNOSIS — L80 Vitiligo: Secondary | ICD-10-CM | POA: Diagnosis not present

## 2019-04-19 DIAGNOSIS — F900 Attention-deficit hyperactivity disorder, predominantly inattentive type: Secondary | ICD-10-CM | POA: Diagnosis not present

## 2019-04-19 DIAGNOSIS — R030 Elevated blood-pressure reading, without diagnosis of hypertension: Secondary | ICD-10-CM | POA: Diagnosis not present

## 2019-04-19 DIAGNOSIS — Z6828 Body mass index (BMI) 28.0-28.9, adult: Secondary | ICD-10-CM

## 2019-04-19 DIAGNOSIS — Z0000 Encounter for general adult medical examination without abnormal findings: Secondary | ICD-10-CM

## 2019-04-19 NOTE — Patient Instructions (Signed)
Thank you for coming in today. Get labs.  Pay attention to work setup at home.  Especially phone use and computer and keyboard location.  Do some home exercises.  If not improved let me know and I can order PT.   Recheck yearly.  I recommend you get a flu vaccine this fall.  OK to schedule nurse visit for mid April for flu shot here or get one at your doctor at work or a pharmacy.

## 2019-04-19 NOTE — Progress Notes (Signed)
Justin Wang is a 33 y.o. male who presents to [facility] today for well adult visit.   Justin Wang is doing well.  He is happy with how things are going.  He is working from home now with COVID-19.  He has ADHD which is managed with a psychiatry nurse practitioner closer to where he works in [place].  He notes he had a rough transition to working from home but has figured out now and is pretty happy with how things are going now.  He takes Adderall which helps a lot and does not cause obnoxious side effects including insomnia.  He has vitiligo but notes has not changed much and is happy with that.  Additionally he denies any depression or anxiety symptoms.  He is done to get some exercise by running and cycling.  He notes a little bit of left-sided neck pain starting from work from home.  He is adjusted his work set up to have a monitor at eye level which has helped some.   ROS as above:  Past Medical History:  Diagnosis Date  . Attention deficit 04/09/2016  . Depression 04/09/2016  . Vitiligo 11/11/2016   Past Surgical History:  Procedure Laterality Date  . ADENOIDECTOMY     Social History   Tobacco Use  . Smoking status: Never Smoker  . Smokeless tobacco: Never Used  Substance Use Topics  . Alcohol use: Yes    Alcohol/week: 1.0 - 2.0 standard drinks    Types: 1 - 2 Cans of beer per week    Comment: weekends   family history includes Bipolar disorder in his paternal grandmother and sister; Diabetes in his father; Hypertension in his mother and sister; Ovarian cancer in his mother; Sleep apnea in his father, mother, and sister.  Medications: Current Outpatient Medications  Medication Sig Dispense Refill  . amphetamine-dextroamphetamine (ADDERALL XR) 20 MG 24 hr capsule Take 1 capsule (20 mg total) by mouth daily. 30 capsule 0  . cetirizine (ZYRTEC) 10 MG  tablet Take 10 mg by mouth daily.     No current facility-administered medications for this visit.    Allergies  Allergen Reactions  . Cefaclor     Health Maintenance Health Maintenance  Topic Date Due  . INFLUENZA VACCINE  04/14/2019  . TETANUS/TDAP  11/12/2026  . HIV Screening  Completed     Exam:  BP 125/85   Pulse 63   Temp 97.7 F (36.5 C) (Oral)   Wt 222 lb (100.7 kg)   BMI 28.50 kg/m  Wt Readings from Last 5 Encounters:  04/19/19 222 lb (100.7 kg)  11/16/17 221 lb (100.2 kg)  07/13/17 220 lb (99.8 kg)  03/10/17 221 lb (100.2 kg)  11/11/16 217 lb (98.4 kg)     Gen: Well NAD HEENT: EOMI,  MMM Lungs: Normal work of breathing. CTABL Heart: RRR no MRG Abd: NABS, Soft. Nondistended, Nontender Exts: Brisk capillary refill, warm and well perfused.  Psych: Alert and oriented normal speech thought process and affect. C-spine: Nontender to cervical midline.  Minimally tender to palpation left trapezius. Normal cervical motion.  Upper extremity strength reflexes and sensation are equal normal throughout.  Depression screen [facility] 2/9 04/19/2019 11/16/2017 11/11/2016 04/09/2016  Decreased Interest 1 0 0 3  Down, Depressed, Hopeless 0 0 0 2  PHQ - 2 Score 1 0 0 5  Altered sleeping 0 0 1 3  Tired, decreased energy 1 0 0 2  Change in appetite 0 0 0 0  Feeling bad or failure about yourself  0 0 0 0  Trouble concentrating 1 0 0 3  Moving slowly or fidgety/restless 0 0 1 3  Suicidal thoughts 0 0 0 0  PHQ-9 Score 3 0 2 16  Difficult doing work/chores Somewhat difficult Not difficult at all - Very difficult       Assessment and Plan: 33 y.o. male with  Well adult visit.  Doing quite well.  Plan to recheck basic fasting labs.  Patient had minimally elevated LDL and has minimally elevated blood pressure.  Encouraged good healthy lifestyle.  Continue comanagement of ADHD with Brady-Fleming, Colonel BaldAngela Mary FNP.  Recheck with me yearly or sooner if needed.  Recommend flu vaccine this  fall.  PDMP reviewed during this encounter. Orders Placed This Encounter  Procedures  . CBC  . COMPLETE METABOLIC PANEL WITH GFR  . Lipid Panel w/reflex Direct LDL   No orders of the defined types were placed in this encounter.    Discussed warning signs or symptoms. Please see discharge instructions. Patient expresses understanding.

## 2019-04-20 LAB — CBC
HCT: 45.2 % (ref 38.5–50.0)
Hemoglobin: 15.2 g/dL (ref 13.2–17.1)
MCH: 29.7 pg (ref 27.0–33.0)
MCHC: 33.6 g/dL (ref 32.0–36.0)
MCV: 88.3 fL (ref 80.0–100.0)
MPV: 10.6 fL (ref 7.5–12.5)
Platelets: 252 10*3/uL (ref 140–400)
RBC: 5.12 10*6/uL (ref 4.20–5.80)
RDW: 13 % (ref 11.0–15.0)
WBC: 6.1 10*3/uL (ref 3.8–10.8)

## 2019-04-20 LAB — COMPLETE METABOLIC PANEL WITH GFR
AG Ratio: 2 (calc) (ref 1.0–2.5)
ALT: 26 U/L (ref 9–46)
AST: 20 U/L (ref 10–40)
Albumin: 4.8 g/dL (ref 3.6–5.1)
Alkaline phosphatase (APISO): 49 U/L (ref 36–130)
BUN: 16 mg/dL (ref 7–25)
CO2: 28 mmol/L (ref 20–32)
Calcium: 9.7 mg/dL (ref 8.6–10.3)
Chloride: 103 mmol/L (ref 98–110)
Creat: 1.08 mg/dL (ref 0.60–1.35)
GFR, Est African American: 104 mL/min/{1.73_m2} (ref >=60)
GFR, Est Non African American: 90 mL/min/{1.73_m2} (ref >=60)
Globulin: 2.4 g/dL (calc) (ref 1.9–3.7)
Glucose, Bld: 97 mg/dL (ref 65–99)
Potassium: 4.7 mmol/L (ref 3.5–5.3)
Sodium: 139 mmol/L (ref 135–146)
Total Bilirubin: 0.5 mg/dL (ref 0.2–1.2)
Total Protein: 7.2 g/dL (ref 6.1–8.1)

## 2019-04-20 LAB — LIPID PANEL W/REFLEX DIRECT LDL
Cholesterol: 190 mg/dL (ref <200)
HDL: 45 mg/dL (ref >=40)
LDL Cholesterol (Calc): 126 mg/dL (calc) — ABNORMAL HIGH
Non-HDL Cholesterol (Calc): 145 mg/dL (calc) — ABNORMAL HIGH (ref <130)
Total CHOL/HDL Ratio: 4.2 (calc) (ref <5.0)
Triglycerides: 88 mg/dL (ref <150)

## 2020-08-05 ENCOUNTER — Ambulatory Visit (INDEPENDENT_AMBULATORY_CARE_PROVIDER_SITE_OTHER): Payer: Managed Care, Other (non HMO) | Admitting: Family Medicine

## 2020-08-05 ENCOUNTER — Encounter: Payer: Self-pay | Admitting: Family Medicine

## 2020-08-05 VITALS — BP 129/74 | HR 56 | Temp 98.0°F | Wt 230.5 lb

## 2020-08-05 DIAGNOSIS — Z0000 Encounter for general adult medical examination without abnormal findings: Secondary | ICD-10-CM

## 2020-08-05 DIAGNOSIS — Z13220 Encounter for screening for lipoid disorders: Secondary | ICD-10-CM | POA: Diagnosis not present

## 2020-08-05 DIAGNOSIS — R6882 Decreased libido: Secondary | ICD-10-CM | POA: Diagnosis not present

## 2020-08-05 NOTE — Progress Notes (Signed)
Justin Wang - 34 y.o. male MRN [id number]  Date of birth: [birth year]  Subjective No chief complaint on file.   HPI Justin Wang is a 34 y.o. male here today for annual exam.  He has been in pretty good health.  He has a history of ADHD that is managed with Adderall XR, this is prescribed by outside provider.   He has concerns about some erectile difficulties.  Able to initially achieve erection but has difficulty maintaining.  He feels like desire and drive is normal.  He also notices that when he is running his penis retracts into his body.  Denies pain or discomfort with this.  No testicular pain.   He is a non-smoker and consumes EtOH occasionally.   He is exercising regularly. He feels like diet is pretty good.   Denies increased stress, depression or anxiety.   Review of Systems  Constitutional: Negative for chills, fever, malaise/fatigue and weight loss.  HENT: Negative for congestion, ear pain and sore throat.   Eyes: Negative for blurred vision, double vision and pain.  Respiratory: Negative for cough and shortness of breath.   Cardiovascular: Negative for chest pain and palpitations.  Gastrointestinal: Negative for abdominal pain, blood in stool, constipation, heartburn and nausea.  Genitourinary: Negative for dysuria and urgency.  Musculoskeletal: Negative for joint pain and myalgias.  Neurological: Negative for dizziness and headaches.  Endo/Heme/Allergies: Does not bruise/bleed easily.  Psychiatric/Behavioral: Negative for depression. The patient is not nervous/anxious and does not have insomnia.     Allergies  Allergen Reactions  . Cefaclor     Past Medical History:  Diagnosis Date  . Attention deficit 04/09/2016  . Depression 04/09/2016  . Vitiligo 11/11/2016    Past Surgical History:  Procedure Laterality Date  . ADENOIDECTOMY      Social History   Socioeconomic History  . Marital status: Single    Spouse name: Not on file  . Number of children: Not on  file  . Years of education: Not on file  . Highest education level: Not on file  Occupational History  . Not on file  Tobacco Use  . Smoking status: Never Smoker  . Smokeless tobacco: Never Used  Substance and Sexual Activity  . Alcohol use: Yes    Alcohol/week: 1.0 - 2.0 standard drink    Types: 1 - 2 Cans of beer per week    Comment: weekends  . Drug use: No  . Sexual activity: Yes    Partners: Female    Birth control/protection: Condom  Other Topics Concern  . Not on file  Social History Narrative  . Not on file   Social Determinants of Health   Financial Resource Strain:   . Difficulty of Paying Living Expenses: Not on file  Food Insecurity:   . Worried About Programme researcher, broadcasting/film/video in the Last Year: Not on file  . Ran Out of Food in the Last Year: Not on file  Transportation Needs:   . Lack of Transportation (Medical): Not on file  . Lack of Transportation (Non-Medical): Not on file  Physical Activity:   . Days of Exercise per Week: Not on file  . Minutes of Exercise per Session: Not on file  Stress:   . Feeling of Stress : Not on file  Social Connections:   . Frequency of Communication with Friends and Family: Not on file  . Frequency of Social Gatherings with Friends and Family: Not on file  . Attends Religious Services: Not on file  .  Active Member of Clubs or Organizations: Not on file  . Attends Banker Meetings: Not on file  . Marital Status: Not on file    Family History  Problem Relation Age of Onset  . Hypertension Mother   . Sleep apnea Mother   . Ovarian cancer Mother   . Diabetes Father   . Sleep apnea Father   . Hypertension Sister   . Sleep apnea Sister   . Bipolar disorder Sister   . Bipolar disorder Paternal Grandmother     Health Maintenance  Topic Date Due  . Hepatitis C Screening  Never done  . INFLUENZA VACCINE  Never done  . TETANUS/TDAP  11/12/2026  . COVID-19 Vaccine  Completed  . HIV Screening  Completed      ----------------------------------------------------------------------------------------------------------------------------------------------------------------------------------------------------------------- Physical Exam There were no vitals taken for this visit.  Physical Exam Constitutional:      General: He is not in acute distress. HENT:     Head: Normocephalic and atraumatic.     Right Ear: External ear normal.     Left Ear: External ear normal.  Eyes:     General: No scleral icterus. Neck:     Thyroid: No thyromegaly.  Cardiovascular:     Rate and Rhythm: Normal rate and regular rhythm.     Heart sounds: Normal heart sounds.  Pulmonary:     Effort: Pulmonary effort is normal.     Breath sounds: Normal breath sounds.  Abdominal:     General: Bowel sounds are normal. There is no distension.     Palpations: Abdomen is soft.     Tenderness: There is no abdominal tenderness. There is no guarding.  Musculoskeletal:     Cervical back: Normal range of motion.  Lymphadenopathy:     Cervical: No cervical adenopathy.  Skin:    General: Skin is warm and dry.     Findings: No rash.  Neurological:     General: No focal deficit present.     Mental Status: He is alert and oriented to person, place, and time.     Cranial Nerves: No cranial nerve deficit.     Motor: No abnormal muscle tone.  Psychiatric:        Mood and Affect: Mood normal.        Behavior: Behavior normal.     ------------------------------------------------------------------------------------------------------------------------------------------------------------------------------------------------------------------- Assessment and Plan  Well adult exam Well adult Orders Placed This Encounter  Procedures  . COMPLETE METABOLIC PANEL WITH GFR  . CBC  . Lipid Profile  . TSH  . Testosterone  Screening: lipid profile.  Immunizations: Declines flu vaccine Checking testosterone due to erectile  difficulties.  Anticipatory guidance/Risk factor reduction:  Recommendations per AVS.    No orders of the defined types were placed in this encounter.   No follow-ups on file.    This visit occurred during the SARS-CoV-2 public health emergency.  Safety protocols were in place, including screening questions prior to the visit, additional usage of staff PPE, and extensive cleaning of exam room while observing appropriate contact time as indicated for disinfecting solutions.

## 2020-08-05 NOTE — Assessment & Plan Note (Signed)
Well adult Orders Placed This Encounter  Procedures  . COMPLETE METABOLIC PANEL WITH GFR  . CBC  . Lipid Profile  . TSH  . Testosterone  Screening: lipid profile.  Immunizations: Declines flu vaccine Checking testosterone due to erectile difficulties.  Anticipatory guidance/Risk factor reduction:  Recommendations per AVS.

## 2020-08-05 NOTE — Patient Instructions (Signed)
Preventive Care 19-34 Years Old, Male Preventive care refers to lifestyle choices and visits with your health care provider that can promote health and wellness. This includes:  A yearly physical exam. This is also called an annual well check.  Regular dental and eye exams.  Immunizations.  Screening for certain conditions.  Healthy lifestyle choices, such as eating a healthy diet, getting regular exercise, not using drugs or products that contain nicotine and tobacco, and limiting alcohol use. What can I expect for my preventive care visit? Physical exam Your health care provider will check:  Height and weight. These may be used to calculate body mass index (BMI), which is a measurement that tells if you are at a healthy weight.  Heart rate and blood pressure.  Your skin for abnormal spots. Counseling Your health care provider may ask you questions about:  Alcohol, tobacco, and drug use.  Emotional well-being.  Home and relationship well-being.  Sexual activity.  Eating habits.  Work and work Statistician. What immunizations do I need?  Influenza (flu) vaccine  This is recommended every year. Tetanus, diphtheria, and pertussis (Tdap) vaccine  You may need a Td booster every 10 years. Varicella (chickenpox) vaccine  You may need this vaccine if you have not already been vaccinated. Human papillomavirus (HPV) vaccine  If recommended by your health care provider, you may need three doses over 6 months. Measles, mumps, and rubella (MMR) vaccine  You may need at least one dose of MMR. You may also need a second dose. Meningococcal conjugate (MenACWY) vaccine  One dose is recommended if you are 45-76 years old and a Market researcher living in a residence hall, or if you have one of several medical conditions. You may also need additional booster doses. Pneumococcal conjugate (PCV13) vaccine  You may need this if you have certain conditions and were not  previously vaccinated. Pneumococcal polysaccharide (PPSV23) vaccine  You may need one or two doses if you smoke cigarettes or if you have certain conditions. Hepatitis A vaccine  You may need this if you have certain conditions or if you travel or work in places where you may be exposed to hepatitis A. Hepatitis B vaccine  You may need this if you have certain conditions or if you travel or work in places where you may be exposed to hepatitis B. Haemophilus influenzae type b (Hib) vaccine  You may need this if you have certain risk factors. You may receive vaccines as individual doses or as more than one vaccine together in one shot (combination vaccines). Talk with your health care provider about the risks and benefits of combination vaccines. What tests do I need? Blood tests  Lipid and cholesterol levels. These may be checked every 5 years starting at age 17.  Hepatitis C test.  Hepatitis B test. Screening   Diabetes screening. This is done by checking your blood sugar (glucose) after you have not eaten for a while (fasting).  Sexually transmitted disease (STD) testing. Talk with your health care provider about your test results, treatment options, and if necessary, the need for more tests. Follow these instructions at home: Eating and drinking   Eat a diet that includes fresh fruits and vegetables, whole grains, lean protein, and low-fat dairy products.  Take vitamin and mineral supplements as recommended by your health care provider.  Do not drink alcohol if your health care provider tells you not to drink.  If you drink alcohol: ? Limit how much you have to 0-2  drinks a day. ? Be aware of how much alcohol is in your drink. In the [place] one drink equals one 12 oz bottle of beer (355 mL), one 5 oz glass of wine (148 mL), or one 1 oz glass of hard liquor (44 mL). Lifestyle  Take daily care of your teeth and gums.  Stay active. Exercise for at least 30 minutes on 5 or  more days each week.  Do not use any products that contain nicotine or tobacco, such as cigarettes, e-cigarettes, and chewing tobacco. If you need help quitting, ask your health care provider.  If you are sexually active, practice safe sex. Use a condom or other form of protection to prevent STIs (sexually transmitted infections). What's next?  Go to your health care provider once a year for a well check visit.  Ask your health care provider how often you should have your eyes and teeth checked.  Stay up to date on all vaccines. This information is not intended to replace advice given to you by your health care provider. Make sure you discuss any questions you have with your health care provider. Document Revised: 08/24/2018 Document Reviewed: 08/24/2018 Elsevier Patient Education  2020 [employer].

## 2020-08-12 LAB — CBC
HCT: 44.9 % (ref 38.5–50.0)
Hemoglobin: 14.9 g/dL (ref 13.2–17.1)
MCH: 30 pg (ref 27.0–33.0)
MCHC: 33.2 g/dL (ref 32.0–36.0)
MCV: 90.3 fL (ref 80.0–100.0)
MPV: 10.6 fL (ref 7.5–12.5)
Platelets: 238 10*3/uL (ref 140–400)
RBC: 4.97 10*6/uL (ref 4.20–5.80)
RDW: 12.4 % (ref 11.0–15.0)
WBC: 5.3 10*3/uL (ref 3.8–10.8)

## 2020-08-12 LAB — LIPID PANEL
Cholesterol: 159 mg/dL (ref <200)
HDL: 49 mg/dL (ref >=40)
LDL Cholesterol (Calc): 94 mg/dL (calc)
Non-HDL Cholesterol (Calc): 110 mg/dL (calc) (ref <130)
Total CHOL/HDL Ratio: 3.2 (calc) (ref <5.0)
Triglycerides: 70 mg/dL (ref <150)

## 2020-08-12 LAB — COMPLETE METABOLIC PANEL WITH GFR
AG Ratio: 2.1 (calc) (ref 1.0–2.5)
ALT: 33 U/L (ref 9–46)
AST: 24 U/L (ref 10–40)
Albumin: 4.4 g/dL (ref 3.6–5.1)
Alkaline phosphatase (APISO): 47 U/L (ref 36–130)
BUN: 15 mg/dL (ref 7–25)
CO2: 28 mmol/L (ref 20–32)
Calcium: 9.2 mg/dL (ref 8.6–10.3)
Chloride: 106 mmol/L (ref 98–110)
Creat: 1.13 mg/dL (ref 0.60–1.35)
GFR, Est African American: 98 mL/min/{1.73_m2} (ref >=60)
GFR, Est Non African American: 84 mL/min/{1.73_m2} (ref >=60)
Globulin: 2.1 g/dL (calc) (ref 1.9–3.7)
Glucose, Bld: 108 mg/dL — ABNORMAL HIGH (ref 65–99)
Potassium: 4.2 mmol/L (ref 3.5–5.3)
Sodium: 140 mmol/L (ref 135–146)
Total Bilirubin: 0.5 mg/dL (ref 0.2–1.2)
Total Protein: 6.5 g/dL (ref 6.1–8.1)

## 2020-08-12 LAB — TESTOSTERONE: Testosterone: 529 ng/dL (ref 250–827)

## 2020-08-12 LAB — TSH: TSH: 3.42 mIU/L (ref 0.40–4.50)

## 2021-07-22 ENCOUNTER — Ambulatory Visit (INDEPENDENT_AMBULATORY_CARE_PROVIDER_SITE_OTHER): Payer: Managed Care, Other (non HMO) | Admitting: Sports Medicine

## 2021-07-22 ENCOUNTER — Ambulatory Visit (INDEPENDENT_AMBULATORY_CARE_PROVIDER_SITE_OTHER): Payer: Managed Care, Other (non HMO)

## 2021-07-22 ENCOUNTER — Other Ambulatory Visit: Payer: Self-pay

## 2021-07-22 DIAGNOSIS — G8929 Other chronic pain: Secondary | ICD-10-CM

## 2021-07-22 DIAGNOSIS — M542 Cervicalgia: Secondary | ICD-10-CM | POA: Diagnosis not present

## 2021-07-22 MED ORDER — MELOXICAM 15 MG PO TABS: ORAL_TABLET | ORAL | 3 refills | Status: DC

## 2021-07-22 NOTE — Assessment & Plan Note (Signed)
Justin Wang is a pleasant 35 year old male, he works in an office job with computer, for the past several months he said increasing pain bilateral neck radiating up to the base of the skull, with some radiation to the left trapezius and left supraclavicular region he does complain of some fullness in the left supraclavicular region. Nothing overtly radicular, no red flag symptoms. On exam he does not have any supraclavicular lymphadenopathy, no discrete areas of tenderness to palpation, for the most part good motion on exam today. I think this is mild cervical degenerative disc disease, we discussed the anatomy, evolution and pathophysiology. We will start conservatively, x-rays, meloxicam, continue good ergonomics at work, and home conditioning exercises/home physical therapy. Return to see me in 6 weeks, MRI if no better.

## 2021-07-22 NOTE — Progress Notes (Signed)
    Procedures performed today:    None.  Independent interpretation of notes and tests performed by another provider:   None.  Brief History, Exam, Impression, and Recommendations:    Chronic neck pain Justin Wang is a pleasant 35 year old male, he works in an office job with computer, for the past several months he said increasing pain bilateral neck radiating up to the base of the skull, with some radiation to the left trapezius and left supraclavicular region he does complain of some fullness in the left supraclavicular region. Nothing overtly radicular, no red flag symptoms. On exam he does not have any supraclavicular lymphadenopathy, no discrete areas of tenderness to palpation, for the most part good motion on exam today. I think this is mild cervical degenerative disc disease, we discussed the anatomy, evolution and pathophysiology. We will start conservatively, x-rays, meloxicam, continue good ergonomics at work, and home conditioning exercises/home physical therapy. Return to see me in 6 weeks, MRI if no better.  Chronic process with exacerbation and pharmacologic intervention  ___________________________________________ Ihor Austin. Benjamin Stain, M.D., ABFM., CAQSM. Primary Care and Sports Medicine [facility]  Adjunct Instructor of Family Medicine  [employer]

## 2021-09-02 ENCOUNTER — Other Ambulatory Visit: Payer: Self-pay

## 2021-09-02 ENCOUNTER — Ambulatory Visit (INDEPENDENT_AMBULATORY_CARE_PROVIDER_SITE_OTHER): Payer: Managed Care, Other (non HMO) | Admitting: Sports Medicine

## 2021-09-02 DIAGNOSIS — G8929 Other chronic pain: Secondary | ICD-10-CM

## 2021-09-02 DIAGNOSIS — M542 Cervicalgia: Secondary | ICD-10-CM | POA: Diagnosis not present

## 2021-09-02 NOTE — Patient Instructions (Signed)
Look into Lyrica

## 2021-09-02 NOTE — Assessment & Plan Note (Signed)
This is a pleasant 35 year old male, chronic axial neck pain. We have started with conservative measures including medications, home therapy. Noticed some improvement in discomfort but still has some muscular tightness. X-rays at the last visit did show straightening/reversal of the normal cervical lordosis. Will continue conservative measures for now, I would like Scott to look into Lyrica as a possible option, I would also like him to discontinue meloxicam for a week and see if this worsens his discomfort, if the pain is no worse then we will continue to hold off of meloxicam. MRI is certainly an option, he can call me if he desires 1, otherwise return to see me in 4 to 8 weeks.

## 2021-09-02 NOTE — Progress Notes (Signed)
° ° °  Procedures performed today:    None.  Independent interpretation of notes and tests performed by another provider:   None.  Brief History, Exam, Impression, and Recommendations:    Chronic neck pain This is a pleasant 35 year old male, chronic axial neck pain. We have started with conservative measures including medications, home therapy. Noticed some improvement in discomfort but still has some muscular tightness. X-rays at the last visit did show straightening/reversal of the normal cervical lordosis. Will continue conservative measures for now, I would like Scott to look into Lyrica as a possible option, I would also like him to discontinue meloxicam for a week and see if this worsens his discomfort, if the pain is no worse then we will continue to hold off of meloxicam. MRI is certainly an option, he can call me if he desires 1, otherwise return to see me in 4 to 8 weeks.  I spent 30 minutes of total time managing this patient today, this includes chart review, face to face, and non-face to face time.  ___________________________________________ Ihor Austin. Benjamin Stain, M.D., ABFM., CAQSM. Primary Care and Sports Medicine [facility]  Adjunct Instructor of Family Medicine  [employer]

## 2021-10-14 ENCOUNTER — Ambulatory Visit: Payer: Managed Care, Other (non HMO) | Admitting: Sports Medicine

## 2021-11-02 ENCOUNTER — Encounter: Payer: Self-pay | Admitting: Sports Medicine

## 2021-11-02 DIAGNOSIS — M542 Cervicalgia: Secondary | ICD-10-CM

## 2021-11-02 DIAGNOSIS — G8929 Other chronic pain: Secondary | ICD-10-CM

## 2021-11-04 ENCOUNTER — Ambulatory Visit: Payer: Managed Care, Other (non HMO) | Admitting: Sports Medicine

## 2021-11-09 ENCOUNTER — Other Ambulatory Visit: Payer: Self-pay

## 2021-11-09 ENCOUNTER — Ambulatory Visit (INDEPENDENT_AMBULATORY_CARE_PROVIDER_SITE_OTHER): Payer: Managed Care, Other (non HMO)

## 2021-11-09 DIAGNOSIS — G8929 Other chronic pain: Secondary | ICD-10-CM

## 2021-11-09 DIAGNOSIS — M542 Cervicalgia: Secondary | ICD-10-CM | POA: Diagnosis not present

## 2021-11-12 ENCOUNTER — Encounter: Payer: Managed Care, Other (non HMO) | Admitting: Family Medicine

## 2021-11-13 ENCOUNTER — Ambulatory Visit: Payer: Managed Care, Other (non HMO) | Admitting: Sports Medicine

## 2021-12-10 ENCOUNTER — Ambulatory Visit (INDEPENDENT_AMBULATORY_CARE_PROVIDER_SITE_OTHER): Payer: Managed Care, Other (non HMO) | Admitting: Family Medicine

## 2021-12-10 ENCOUNTER — Encounter: Payer: Self-pay | Admitting: Family Medicine

## 2021-12-10 VITALS — BP 128/86 | HR 65 | Ht 74.0 in | Wt 252.0 lb

## 2021-12-10 DIAGNOSIS — Z0000 Encounter for general adult medical examination without abnormal findings: Secondary | ICD-10-CM

## 2021-12-10 DIAGNOSIS — Z13220 Encounter for screening for lipoid disorders: Secondary | ICD-10-CM

## 2021-12-10 NOTE — Progress Notes (Signed)
?Justin Wang - 36 y.o. male MRN [id number]  Date of birth: Jun 24, 1986 ? ?Subjective ?Chief Complaint  ?Patient presents with  ? Annual Exam  ? ? ?HPI ?Justin Wang is a 36 year old male here today for annual exam.  Reports overall he is doing well at this time.  He would like to have updated labs completed.  He has had some hair thinning likely options to help with this. ? ?He does feel that his diet remains pretty healthy.  He is not quite as active as he has been a few years ago. ? ?He is a non-smoker.  He consumes alcohol occasionally. ? ?Review of Systems  ?Constitutional:  Negative for chills, fever, malaise/fatigue and weight loss.  ?HENT:  Negative for congestion, ear pain and sore throat.   ?Eyes:  Negative for blurred vision, double vision and pain.  ?Respiratory:  Negative for cough and shortness of breath.   ?Cardiovascular:  Negative for chest pain and palpitations.  ?Gastrointestinal:  Negative for abdominal pain, blood in stool, constipation, heartburn and nausea.  ?Genitourinary:  Negative for dysuria and urgency.  ?Musculoskeletal:  Negative for joint pain and myalgias.  ?Neurological:  Negative for dizziness and headaches.  ?Endo/Heme/Allergies:  Does not bruise/bleed easily.  ?Psychiatric/Behavioral:  Negative for depression. The patient is not nervous/anxious and does not have insomnia.   ? ?Allergies  ?Allergen Reactions  ? Cefaclor   ? ? ?Past Medical History:  ?Diagnosis Date  ? Attention deficit 04/09/2016  ? Depression 04/09/2016  ? Vitiligo 11/11/2016  ? ? ?Past Surgical History:  ?Procedure Laterality Date  ? ADENOIDECTOMY    ? ? ?Social History  ? ?Socioeconomic History  ? Marital status: Single  ?  Spouse name: Not on file  ? Number of children: Not on file  ? Years of education: Not on file  ? Highest education level: Not on file  ?Occupational History  ? Not on file  ?Tobacco Use  ? Smoking status: Never  ? Smokeless tobacco: Never  ?Substance and Sexual Activity  ? Alcohol use: Yes  ?   Alcohol/week: 1.0 - 2.0 standard drink  ?  Types: 1 - 2 Cans of beer per week  ?  Comment: weekends  ? Drug use: No  ? Sexual activity: Yes  ?  Partners: Female  ?  Birth control/protection: Condom  ?Other Topics Concern  ? Not on file  ?Social History Narrative  ? Not on file  ? ?Social Determinants of Health  ? ?Financial Resource Strain: Not on file  ?Food Insecurity: Not on file  ?Transportation Needs: Not on file  ?Physical Activity: Not on file  ?Stress: Not on file  ?Social Connections: Not on file  ? ? ?Family History  ?Problem Relation Age of Onset  ? Hypertension Mother   ? Sleep apnea Mother   ? Ovarian cancer Mother   ? Diabetes Father   ? Sleep apnea Father   ? Hypertension Sister   ? Sleep apnea Sister   ? Bipolar disorder Sister   ? Bipolar disorder Paternal Grandmother   ? ? ?Health Maintenance  ?Topic Date Due  ? Hepatitis C Screening  Never done  ? COVID-19 Vaccine (4 - Booster for Pfizer series) 08/01/2021  ? TETANUS/TDAP  11/12/2026  ? INFLUENZA VACCINE  Completed  ? HIV Screening  Completed  ? HPV VACCINES  Aged Out  ? ? ? ?----------------------------------------------------------------------------------------------------------------------------------------------------------------------------------------------------------------- ?Physical Exam ?BP 128/86 (BP Location: Left Arm, Patient Position: Sitting, Cuff Size: Large)   Pulse 65  Ht 6\' 2"  (1.88 m)   Wt 252 lb (114.3 kg)   SpO2 97%   BMI 32.35 kg/m?  ? ?Physical Exam ?Constitutional:   ?   General: He is not in acute distress. ?HENT:  ?   Head: Normocephalic and atraumatic.  ?   Right Ear: Tympanic membrane and external ear normal.  ?   Left Ear: Tympanic membrane and external ear normal.  ?Eyes:  ?   General: No scleral icterus. ?Neck:  ?   Thyroid: No thyromegaly.  ?Cardiovascular:  ?   Rate and Rhythm: Normal rate and regular rhythm.  ?   Heart sounds: Normal heart sounds.  ?Pulmonary:  ?   Effort: Pulmonary effort is normal.  ?    Breath sounds: Normal breath sounds.  ?Abdominal:  ?   General: Bowel sounds are normal. There is no distension.  ?   Palpations: Abdomen is soft.  ?   Tenderness: There is no abdominal tenderness. There is no guarding.  ?Musculoskeletal:  ?   Cervical back: Normal range of motion.  ?Lymphadenopathy:  ?   Cervical: No cervical adenopathy.  ?Skin: ?   General: Skin is warm and dry.  ?   Findings: No rash.  ?Neurological:  ?   Mental Status: He is alert and oriented to person, place, and time.  ?   Cranial Nerves: No cranial nerve deficit.  ?   Motor: No abnormal muscle tone.  ?Psychiatric:     ?   Mood and Affect: Mood normal.     ?   Behavior: Behavior normal.  ? ? ?------------------------------------------------------------------------------------------------------------------------------------------------------------------------------------------------------------------- ?Assessment and Plan ? ?Well adult exam ?Well adult ?Orders Placed This Encounter  ?Procedures  ? COMPLETE METABOLIC PANEL WITH GFR  ? CBC with Differential  ? Lipid Panel w/reflex Direct LDL  ? TSH  ? Testosterone  ?Screenings: Per lab orders ?Immunizations: Up-to-date ?Anticipatory guidance/risk factor reduction: Recommendations per AVS. ? ? ?No orders of the defined types were placed in this encounter. ? ? ?No follow-ups on file. ? ? ? ?This visit occurred during the SARS-CoV-2 public health emergency.  Safety protocols were in place, including screening questions prior to the visit, additional usage of staff PPE, and extensive cleaning of exam room while observing appropriate contact time as indicated for disinfecting solutions.  ? ?

## 2021-12-10 NOTE — Patient Instructions (Signed)
Preventive Care 21-36 Years Old, Male ?Preventive care refers to lifestyle choices and visits with your health care provider that can promote health and wellness. Preventive care visits are also called wellness exams. ?What can I expect for my preventive care visit? ?Counseling ?During your preventive care visit, your health care provider may ask about your: ?Medical history, including: ?Past medical problems. ?Family medical history. ?Current health, including: ?Emotional well-being. ?Home life and relationship well-being. ?Sexual activity. ?Lifestyle, including: ?Alcohol, nicotine or tobacco, and drug use. ?Access to firearms. ?Diet, exercise, and sleep habits. ?Safety issues such as seatbelt and bike helmet use. ?Sunscreen use. ?Work and work environment. ?Physical exam ?Your health care provider may check your: ?Height and weight. These may be used to calculate your BMI (body mass index). BMI is a measurement that tells if you are at a healthy weight. ?Waist circumference. This measures the distance around your waistline. This measurement also tells if you are at a healthy weight and may help predict your risk of certain diseases, such as type 2 diabetes and high blood pressure. ?Heart rate and blood pressure. ?Body temperature. ?Skin for abnormal spots. ?What immunizations do I need? ?Vaccines are usually given at various ages, according to a schedule. Your health care provider will recommend vaccines for you based on your age, medical history, and lifestyle or other factors, such as travel or where you work. ?What tests do I need? ?Screening ?Your health care provider may recommend screening tests for certain conditions. This may include: ?Lipid and cholesterol levels. ?Diabetes screening. This is done by checking your blood sugar (glucose) after you have not eaten for a while (fasting). ?Hepatitis B test. ?Hepatitis C test. ?HIV (human immunodeficiency virus) test. ?STI (sexually transmitted infection)  testing, if you are at risk. ?Talk with your health care provider about your test results, treatment options, and if necessary, the need for more tests. ?Follow these instructions at home: ?Eating and drinking ? ?Eat a healthy diet that includes fresh fruits and vegetables, whole grains, lean protein, and low-fat dairy products. ?Drink enough fluid to keep your urine pale yellow. ?Take vitamin and mineral supplements as recommended by your health care provider. ?Do not drink alcohol if your health care provider tells you not to drink. ?If you drink alcohol: ?Limit how much you have to 0-2 drinks a day. ?Know how much alcohol is in your drink. In the [place] one drink equals one 12 oz bottle of beer (355 mL), one 5 oz glass of wine (148 mL), or one 1? oz glass of hard liquor (44 mL). ?Lifestyle ?Brush your teeth every morning and night with fluoride toothpaste. Floss one time each day. ?Exercise for at least 30 minutes 5 or more days each week. ?Do not use any products that contain nicotine or tobacco. These products include cigarettes, chewing tobacco, and vaping devices, such as e-cigarettes. If you need help quitting, ask your health care provider. ?Do not use drugs. ?If you are sexually active, practice safe sex. Use a condom or other form of protection to prevent STIs. ?Find healthy ways to manage stress, such as: ?Meditation, yoga, or listening to music. ?Journaling. ?Talking to a trusted person. ?Spending time with friends and family. ?Minimize exposure to UV radiation to reduce your risk of skin cancer. ?Safety ?Always wear your seat belt while driving or riding in a vehicle. ?Do not drive: ?If you have been drinking alcohol. Do not ride with someone who has been drinking. ?If you have been using any mind-altering substances or   drugs. ?While texting. ?When you are tired or distracted. ?Wear a helmet and other protective equipment during sports activities. ?If you have firearms in your house, make sure you  follow all gun safety procedures. ?Seek help if you have been physically or sexually abused. ?What's next? ?Go to your health care provider once a year for an annual wellness visit. ?Ask your health care provider how often you should have your eyes and teeth checked. ?Stay up to date on all vaccines. ?This information is not intended to replace advice given to you by your health care provider. Make sure you discuss any questions you have with your health care provider. ?Document Revised: 02/25/2021 Document Reviewed: 02/25/2021 ?Elsevier Patient Education ? 2022 Elsevier Inc. ? ?

## 2021-12-10 NOTE — Assessment & Plan Note (Addendum)
Well adult ?Orders Placed This Encounter  ?Procedures  ?? COMPLETE METABOLIC PANEL WITH GFR  ?? CBC with Differential  ?? Lipid Panel w/reflex Direct LDL  ?? TSH  ?? Testosterone  ?Screenings: Per lab orders ?Immunizations: Up-to-date ?Anticipatory guidance/risk factor reduction: Recommendations per AVS. ?

## 2021-12-14 LAB — T4, FREE: Free T4: 0.9 ng/dL (ref 0.8–1.8)

## 2021-12-14 LAB — CBC WITH DIFFERENTIAL/PLATELET
Absolute Monocytes: 838 cells/uL (ref 200–950)
Basophils Absolute: 58 cells/uL (ref 0–200)
Basophils Relative: 0.9 %
Eosinophils Absolute: 198 cells/uL (ref 15–500)
Eosinophils Relative: 3.1 %
HCT: 46.1 % (ref 38.5–50.0)
Hemoglobin: 15.5 g/dL (ref 13.2–17.1)
Lymphs Abs: 2086 cells/uL (ref 850–3900)
MCH: 29.9 pg (ref 27.0–33.0)
MCHC: 33.6 g/dL (ref 32.0–36.0)
MCV: 88.8 fL (ref 80.0–100.0)
MPV: 10.2 fL (ref 7.5–12.5)
Monocytes Relative: 13.1 %
Neutro Abs: 3219 cells/uL (ref 1500–7800)
Neutrophils Relative %: 50.3 %
Platelets: 274 10*3/uL (ref 140–400)
RBC: 5.19 10*6/uL (ref 4.20–5.80)
RDW: 12.7 % (ref 11.0–15.0)
Total Lymphocyte: 32.6 %
WBC: 6.4 10*3/uL (ref 3.8–10.8)

## 2021-12-14 LAB — TESTOSTERONE: Testosterone: 294 ng/dL (ref 250–827)

## 2021-12-14 LAB — COMPLETE METABOLIC PANEL WITH GFR
AG Ratio: 1.9 (calc) (ref 1.0–2.5)
ALT: 56 U/L — ABNORMAL HIGH (ref 9–46)
AST: 27 U/L (ref 10–40)
Albumin: 5 g/dL (ref 3.6–5.1)
Alkaline phosphatase (APISO): 49 U/L (ref 36–130)
BUN: 19 mg/dL (ref 7–25)
CO2: 29 mmol/L (ref 20–32)
Calcium: 10.1 mg/dL (ref 8.6–10.3)
Chloride: 102 mmol/L (ref 98–110)
Creat: 1.16 mg/dL (ref 0.60–1.26)
Globulin: 2.6 g/dL (calc) (ref 1.9–3.7)
Glucose, Bld: 97 mg/dL (ref 65–139)
Potassium: 5.2 mmol/L (ref 3.5–5.3)
Sodium: 141 mmol/L (ref 135–146)
Total Bilirubin: 0.4 mg/dL (ref 0.2–1.2)
Total Protein: 7.6 g/dL (ref 6.1–8.1)
eGFR: 84 mL/min/{1.73_m2} (ref >=60)

## 2021-12-14 LAB — LIPID PANEL W/REFLEX DIRECT LDL
Cholesterol: 200 mg/dL — ABNORMAL HIGH (ref <200)
HDL: 48 mg/dL (ref >=40)
LDL Cholesterol (Calc): 130 mg/dL (calc) — ABNORMAL HIGH
Non-HDL Cholesterol (Calc): 152 mg/dL (calc) — ABNORMAL HIGH (ref <130)
Total CHOL/HDL Ratio: 4.2 (calc) (ref <5.0)
Triglycerides: 111 mg/dL (ref <150)

## 2021-12-14 LAB — TSH: TSH: 5.58 mIU/L — ABNORMAL HIGH (ref 0.40–4.50)

## 2021-12-19 ENCOUNTER — Encounter: Payer: Self-pay | Admitting: Family Medicine

## 2021-12-21 ENCOUNTER — Other Ambulatory Visit: Payer: Self-pay | Admitting: Family Medicine

## 2021-12-21 DIAGNOSIS — R7989 Other specified abnormal findings of blood chemistry: Secondary | ICD-10-CM

## 2021-12-21 NOTE — Telephone Encounter (Signed)
Joy let patient know you would probably recheck TSH in 3-6 months but follow up with you for final recommendations. Please let patient know.  ?

## 2022-11-25 IMAGING — MR MR CERVICAL SPINE W/O CM
5 series · 40 of 48 positions shown · non-contrast
Comparison: X-ray cervical 07/22/2021.

CLINICAL DATA: Chronic neck pain with stiffness, tension, popping
and cracking for 2 years. Interventional planning.

EXAM:
MRI CERVICAL SPINE WITHOUT CONTRAST
TECHNIQUE: Multiplanar, multisequence MR imaging of the cervical spine was
performed. No intravenous contrast was administered.

[Series 2: T2 · sagittal · 3.0mm · 0.86mm/px · 6 of 13 slices shown (1 of 2)]
[im 1/13]
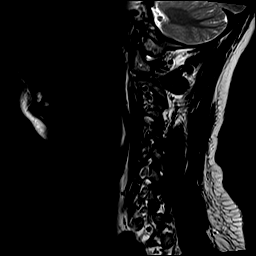
[im 3/13]
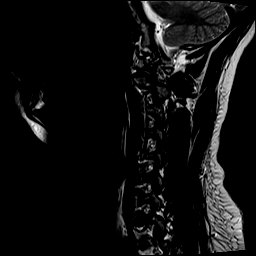
[im 5/13]
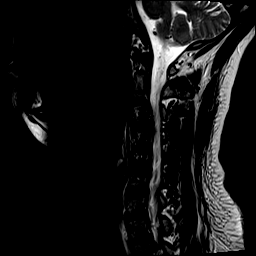
[im 8/13]
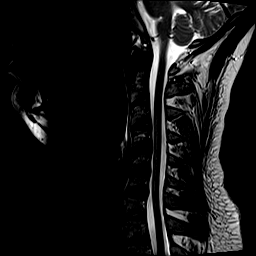
[im 10/13]
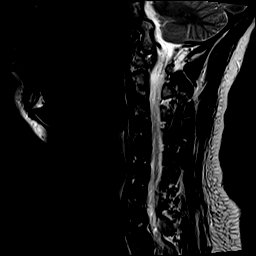
[im 13/13]
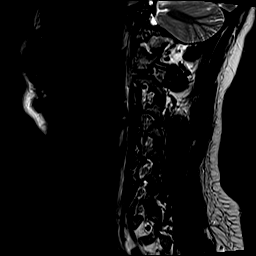

[Series 3: T1 · sagittal · 3.0mm · 0.86mm/px · 5 of 13 slices shown]
[im 1/13]
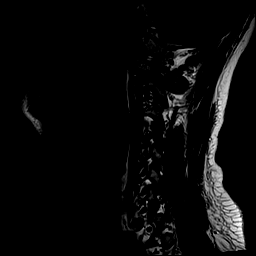
[im 4/13]
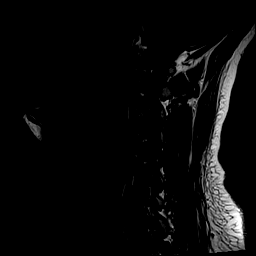
[im 7/13]
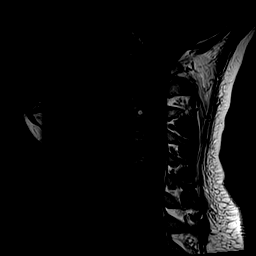
[im 10/13]
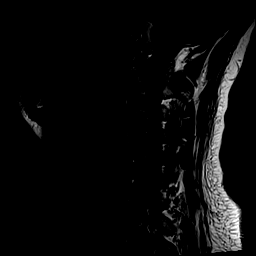
[im 13/13]
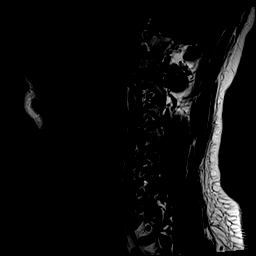

[Series 4: STIR · sagittal · 3.0mm · 0.69mm/px · 5 of 13 slices shown]
[im 1/13]
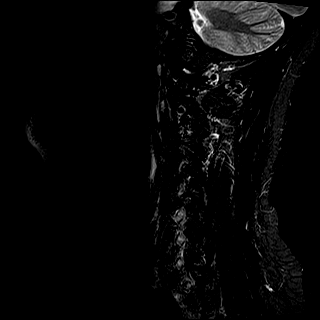
[im 4/13]
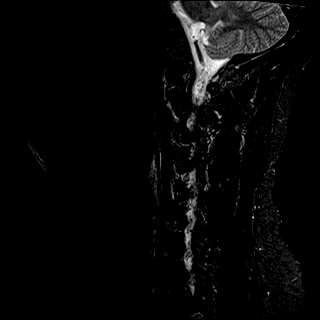
[im 7/13]
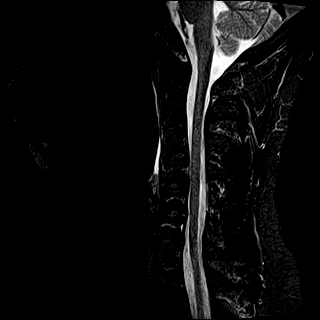
[im 10/13]
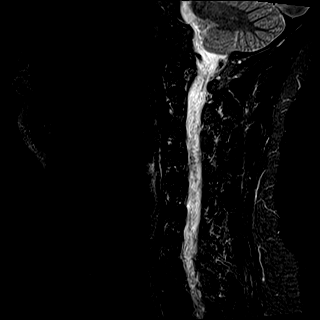
[im 13/13]
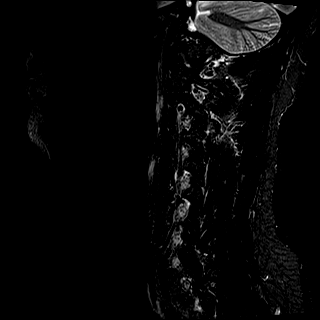

[Series 5: T2 · axial · 3.0mm · 0.70mm/px · z∈[-79,+53]mm · 15 of 37 slices shown (2 of 2)]
[im 1/37]
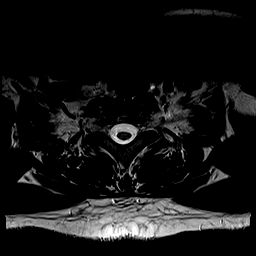
[im 3/37]
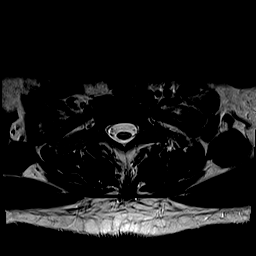
[im 6/37]
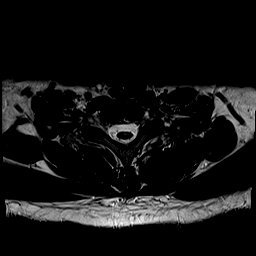
[im 8/37]
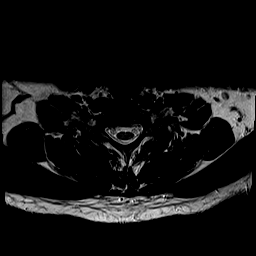
[im 11/37]
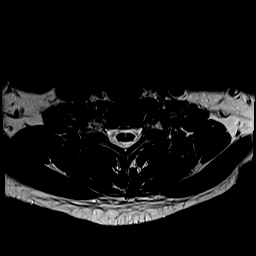
[im 13/37]
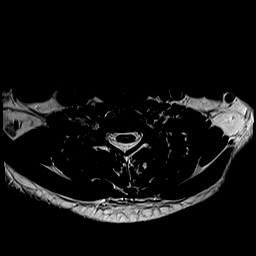
[im 16/37]
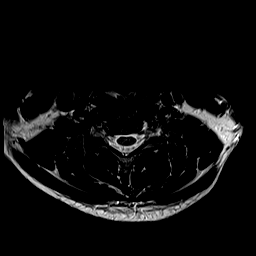
[im 19/37]
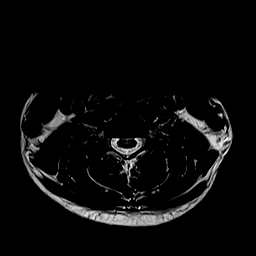
[im 21/37]
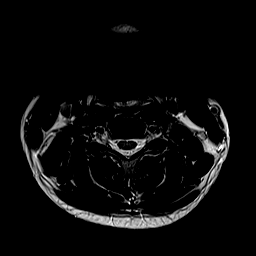
[im 24/37]
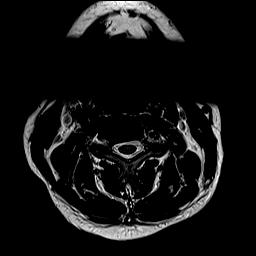
[im 26/37]
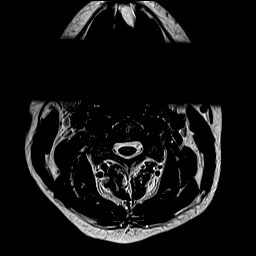
[im 29/37]
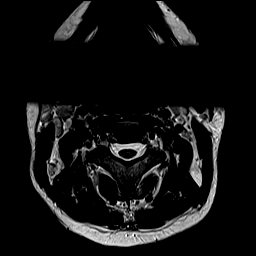
[im 31/37]
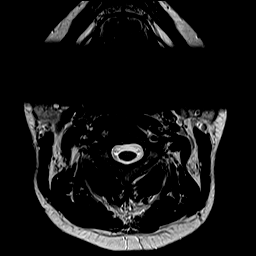
[im 34/37]
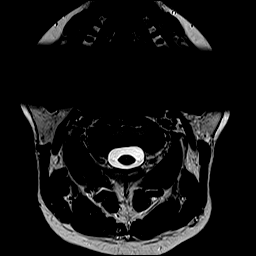
[im 37/37]
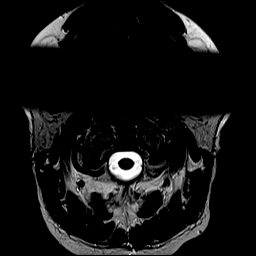

[Series 6: mpgr ax · axial · 3.0mm · 0.35mm/px · z∈[-79,+43]mm · 9 of 40 slices shown]
[im 1/40]
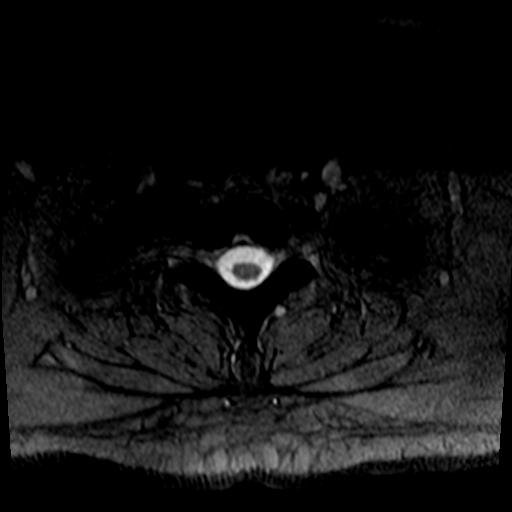
[im 3/40]
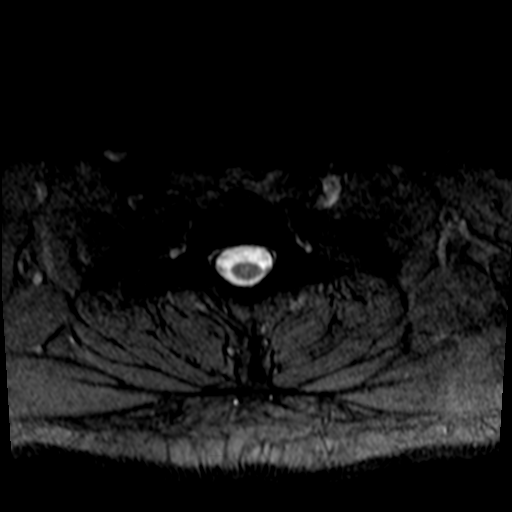
[im 8/40]
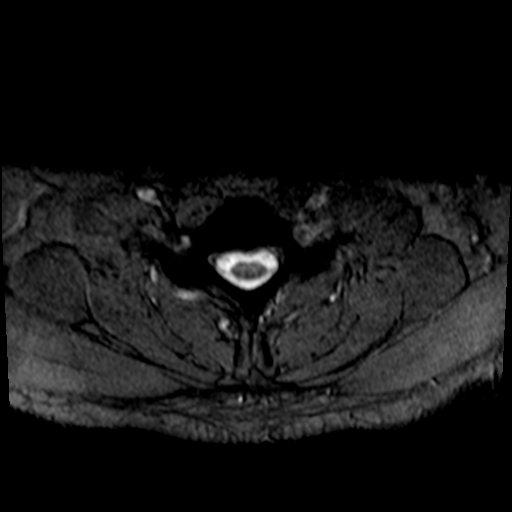
[im 13/40]
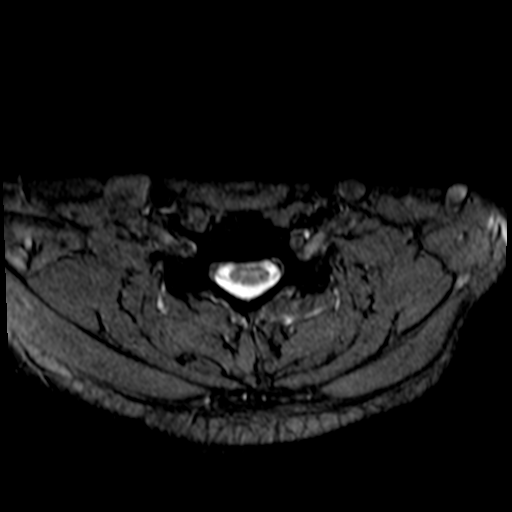
[im 18/40]
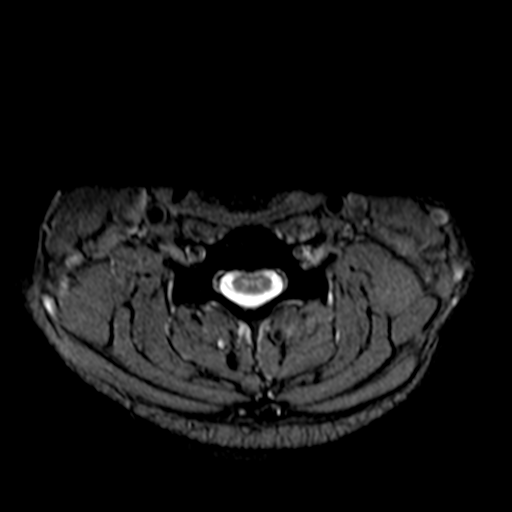
[im 22/40]
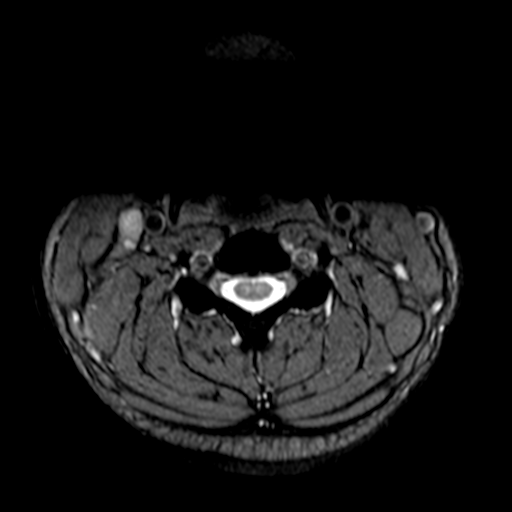
[im 27/40]
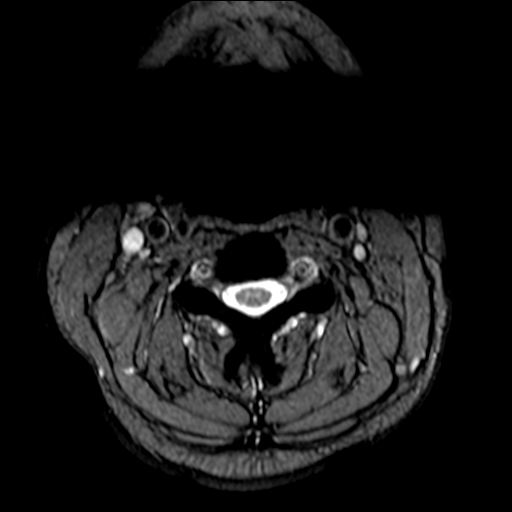
[im 32/40]
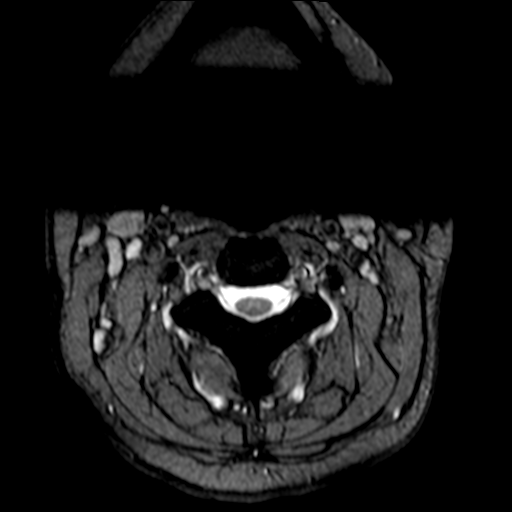
[im 37/40]
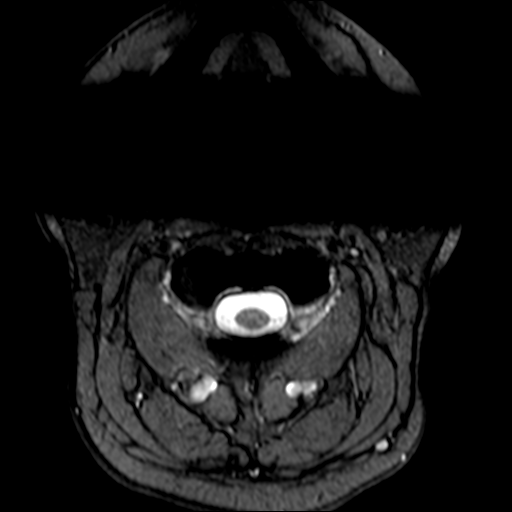

[40 of 48 positions shown; findings below may reference images not displayed]

FINDINGS: Alignment: There is mild reversal of the normal cervical lordosis.
No listhesis.

Vertebrae: No fracture, evidence of discitis, or bone lesion.

Cord: Normal signal throughout.

Posterior Fossa, vertebral arteries, paraspinal tissues: Negative.

Disc levels:

C2-3: Negative.

C3-4: Uncovertebral spurring on the right causes mild right
foraminal narrowing. The central canal and left are open.

C4-5: Negative.

C5-6: Negative.

C6-7: There is a small annular fissure and a very shallow left
paracentral protrusion. The central canal and foramina remain open.

C7-T1: Negative.
IMPRESSION: Mild right foraminal narrowing at C3-4 due to uncovertebral
spurring.

Small annular fissure and shallow left paracentral protrusion at
C6-7 without stenosis.

## 2023-06-27 ENCOUNTER — Ambulatory Visit (INDEPENDENT_AMBULATORY_CARE_PROVIDER_SITE_OTHER): Payer: Managed Care, Other (non HMO) | Admitting: Family Medicine

## 2023-06-27 ENCOUNTER — Encounter: Payer: Self-pay | Admitting: Family Medicine

## 2023-06-27 VITALS — BP 124/85 | HR 77 | Ht 74.0 in | Wt 245.2 lb

## 2023-06-27 DIAGNOSIS — D179 Benign lipomatous neoplasm, unspecified: Secondary | ICD-10-CM | POA: Insufficient documentation

## 2023-06-27 DIAGNOSIS — Z13220 Encounter for screening for lipoid disorders: Secondary | ICD-10-CM

## 2023-06-27 DIAGNOSIS — Z0000 Encounter for general adult medical examination without abnormal findings: Secondary | ICD-10-CM

## 2023-06-27 NOTE — Patient Instructions (Signed)
Preventive Care 37-37 Years Old, Male Preventive care refers to lifestyle choices and visits with your health care provider that can promote health and wellness. Preventive care visits are also called wellness exams. What can I expect for my preventive care visit? Counseling During your preventive care visit, your health care provider may ask about your: Medical history, including: Past medical problems. Family medical history. Current health, including: Emotional well-being. Home life and relationship well-being. Sexual activity. Lifestyle, including: Alcohol, nicotine or tobacco, and drug use. Access to firearms. Diet, exercise, and sleep habits. Safety issues such as seatbelt and bike helmet use. Sunscreen use. Work and work Astronomer. Physical exam Your health care provider may check your: Height and weight. These may be used to calculate your BMI (body mass index). BMI is a measurement that tells if you are at a healthy weight. Waist circumference. This measures the distance around your waistline. This measurement also tells if you are at a healthy weight and may help predict your risk of certain diseases, such as type 2 diabetes and high blood pressure. Heart rate and blood pressure. Body temperature. Skin for abnormal spots. What immunizations do I need?  Vaccines are usually given at various ages, according to a schedule. Your health care provider will recommend vaccines for you based on your age, medical history, and lifestyle or other factors, such as travel or where you work. What tests do I need? Screening Your health care provider may recommend screening tests for certain conditions. This may include: Lipid and cholesterol levels. Diabetes screening. This is done by checking your blood sugar (glucose) after you have not eaten for a while (fasting). Hepatitis B test. Hepatitis C test. HIV (human immunodeficiency virus) test. STI (sexually transmitted infection)  testing, if you are at risk. Talk with your health care provider about your test results, treatment options, and if necessary, the need for more tests. Follow these instructions at home: Eating and drinking  Eat a healthy diet that includes fresh fruits and vegetables, whole grains, lean protein, and low-fat dairy products. Drink enough fluid to keep your urine pale yellow. Take vitamin and mineral supplements as recommended by your health care provider. Do not drink alcohol if your health care provider tells you not to drink. If you drink alcohol: Limit how much you have to 0-2 drinks a day. Know how much alcohol is in your drink. In the [place] one drink equals one 12 oz bottle of beer (355 mL), one 5 oz glass of wine (148 mL), or one 1 oz glass of hard liquor (44 mL). Lifestyle Brush your teeth every morning and night with fluoride toothpaste. Floss one time each day. Exercise for at least 30 minutes 5 or more days each week. Do not use any products that contain nicotine or tobacco. These products include cigarettes, chewing tobacco, and vaping devices, such as e-cigarettes. If you need help quitting, ask your health care provider. Do not use drugs. If you are sexually active, practice safe sex. Use a condom or other form of protection to prevent STIs. Find healthy ways to manage stress, such as: Meditation, yoga, or listening to music. Journaling. Talking to a trusted person. Spending time with friends and family. Minimize exposure to UV radiation to reduce your risk of skin cancer. Safety Always wear your seat belt while driving or riding in a vehicle. Do not drive: If you have been drinking alcohol. Do not ride with someone who has been drinking. If you have been using any mind-altering substances  or drugs. While texting. When you are tired or distracted. Wear a helmet and other protective equipment during sports activities. If you have firearms in your house, make sure you  follow all gun safety procedures. Seek help if you have been physically or sexually abused. What's next? Go to your health care provider once a year for an annual wellness visit. Ask your health care provider how often you should have your eyes and teeth checked. Stay up to date on all vaccines. This information is not intended to replace advice given to you by your health care provider. Make sure you discuss any questions you have with your health care provider. Document Revised: 02/25/2021 Document Reviewed: 02/25/2021 Elsevier Patient Education  2024 [employer].

## 2023-06-27 NOTE — Progress Notes (Signed)
Justin Wang - 37 y.o. male MRN [id number]  Date of birth: [birth year]  Subjective Chief Complaint  Patient presents with   Annual Exam    HPI Justin Wang is a 37 y.o. male here today for annual exam.   He reports that he is doing pretty well.  Had a cousin who recently passed with from colon cancer at age 35.  No other family members that he is aware of that have had colon cancer.   He notes some fatigue over the past 4-5 months.  Multiple lipomas that he would like consultation re: removal.   He is moderately active.  He feels that diet is pretty good.   Non-smoker.  Occasional EtOH use   Review of Systems  Constitutional:  Negative for chills, fever, malaise/fatigue and weight loss.  HENT:  Negative for congestion, ear pain and sore throat.   Eyes:  Negative for blurred vision, double vision and pain.  Respiratory:  Negative for cough and shortness of breath.   Cardiovascular:  Negative for chest pain and palpitations.  Gastrointestinal:  Negative for abdominal pain, blood in stool, constipation, heartburn and nausea.  Genitourinary:  Negative for dysuria and urgency.  Musculoskeletal:  Negative for joint pain and myalgias.  Neurological:  Negative for dizziness and headaches.  Endo/Heme/Allergies:  Does not bruise/bleed easily.  Psychiatric/Behavioral:  Negative for depression. The patient is not nervous/anxious and does not have insomnia.     Allergies  Allergen Reactions   Cefaclor     Past Medical History:  Diagnosis Date   Attention deficit 04/09/2016   Depression 04/09/2016   Vitiligo 11/11/2016    Past Surgical History:  Procedure Laterality Date   ADENOIDECTOMY      Social History   Socioeconomic History   Marital status: Single    Spouse name: Not on file   Number of children: Not on file   Years of education: Not on file   Highest education level: Not on file  Occupational History   Not on file  Tobacco Use   Smoking status: Never   Smokeless  tobacco: Never  Substance and Sexual Activity   Alcohol use: Yes    Alcohol/week: 1.0 - 2.0 standard drink of alcohol    Types: 1 - 2 Cans of beer per week    Comment: weekends   Drug use: No   Sexual activity: Yes    Partners: Female    Birth control/protection: Condom  Other Topics Concern   Not on file  Social History Narrative   Not on file   Social Determinants of Health   Financial Resource Strain: Not on file  Food Insecurity: Not on file  Transportation Needs: Not on file  Physical Activity: High Risk (06/06/2023)   Received from [facility]   Physical Activity    How often do you engage in moderate physical activity for 30 minutes or more?: 4 to 6 days a week    How often do you engage in vigorous physical activity for 20 minutes or more?: 4 to 6 days a week    How many hours per day do you spend sitting?: 6 to 8 hours  Stress: Medium Risk (06/06/2023)   Received from [facility]   Stress    How would you describe the stress in your daily life?: Moderate    How effective are you with dealing with your stress?: Moderately effective  Social Connections: Unknown (01/26/2022)   Received from [facility]   Social Network  Social Network: Not on file    Family History  Problem Relation Age of Onset   Hypertension Mother    Sleep apnea Mother    Ovarian cancer Mother    Diabetes Father    Sleep apnea Father    Hypertension Sister    Sleep apnea Sister    Bipolar disorder Sister    Bipolar disorder Paternal Grandmother     Health Maintenance  Topic Date Due   Hepatitis C Screening  Never done   INFLUENZA VACCINE  04/14/2023   DTaP/Tdap/Td (8 - Td or Tdap) 11/12/2026   COVID-19 Vaccine  Completed   HIV Screening  Completed   HPV VACCINES  Aged Out      ----------------------------------------------------------------------------------------------------------------------------------------------------------------------------------------------------------------- Physical Exam BP 124/85 (BP Location: Left Arm, Patient Position: Sitting, Cuff Size: Large)   Pulse 77   Ht 6\' 2"  (1.88 m)   Wt 245 lb 4 oz (111.2 kg)   SpO2 96%   BMI 31.49 kg/m   Physical Exam Constitutional:      General: He is not in acute distress. HENT:     Head: Normocephalic and atraumatic.     Right Ear: Tympanic membrane and external ear normal.     Left Ear: Tympanic membrane and external ear normal.  Eyes:     General: No scleral icterus. Neck:     Thyroid: No thyromegaly.  Cardiovascular:     Rate and Rhythm: Normal rate and regular rhythm.     Heart sounds: Normal heart sounds.  Pulmonary:     Effort: Pulmonary effort is normal.     Breath sounds: Normal breath sounds.  Abdominal:     General: Bowel sounds are normal. There is no distension.     Palpations: Abdomen is soft.     Tenderness: There is no abdominal tenderness. There is no guarding.  Musculoskeletal:     Cervical back: Normal range of motion.  Lymphadenopathy:     Cervical: No cervical adenopathy.  Skin:    General: Skin is warm and dry.     Findings: No rash.     Comments: Lipomas noted in R arm, back and scalp.   Neurological:     Mental Status: He is alert and oriented to person, place, and time.     Cranial Nerves: No cranial nerve deficit.     Motor: No abnormal muscle tone.  Psychiatric:        Mood and Affect: Mood normal.        Behavior: Behavior normal.     ------------------------------------------------------------------------------------------------------------------------------------------------------------------------------------------------------------------- Assessment and Plan  Well adult exam Well adult Orders Placed This Encounter  Procedures   CBC  with Differential/Platelet   CMP14+EGFR   Lipid Panel With LDL/HDL Ratio   TSH + free T4   Testosterone   Ambulatory referral to General Surgery    Referral Priority:   Routine    Referral Type:   Surgical    Referral Reason:   Specialty Services Required    Requested Specialty:   General Surgery    Number of Visits Requested:   1  Screenings: per lab orders Immunizations:  Declines flu vaccine. Anticipatory guidance/Risk factor reduction:  Recommendations per AVS.    Lipoma Multiple lipomsas noted.  Referral to general surgery.    No orders of the defined types were placed in this encounter.   No follow-ups on file.    This visit occurred during the SARS-CoV-2 public health emergency.  Safety protocols were in place, including screening questions prior  to the visit, additional usage of staff PPE, and extensive cleaning of exam room while observing appropriate contact time as indicated for disinfecting solutions.

## 2023-06-27 NOTE — Assessment & Plan Note (Signed)
Multiple lipomsas noted.  Referral to general surgery.

## 2023-06-27 NOTE — Assessment & Plan Note (Signed)
Well adult Orders Placed This Encounter  Procedures   CBC with Differential/Platelet   CMP14+EGFR   Lipid Panel With LDL/HDL Ratio   TSH + free T4   Testosterone   Ambulatory referral to General Surgery    Referral Priority:   Routine    Referral Type:   Surgical    Referral Reason:   Specialty Services Required    Requested Specialty:   General Surgery    Number of Visits Requested:   1  Screenings: per lab orders Immunizations:  Declines flu vaccine. Anticipatory guidance/Risk factor reduction:  Recommendations per AVS.

## 2023-06-28 ENCOUNTER — Encounter: Payer: Managed Care, Other (non HMO) | Admitting: Family Medicine

## 2023-06-28 LAB — TESTOSTERONE: Testosterone: 451 ng/dL (ref 264–916)

## 2023-06-28 LAB — LIPID PANEL WITH LDL/HDL RATIO
Cholesterol, Total: 215 mg/dL — ABNORMAL HIGH (ref 100–199)
HDL: 44 mg/dL (ref >39)
LDL Chol Calc (NIH): 147 mg/dL — ABNORMAL HIGH (ref 0–99)
LDL/HDL Ratio: 3.3 {ratio} (ref 0.0–3.6)
Triglycerides: 131 mg/dL (ref 0–149)
VLDL Cholesterol Cal: 24 mg/dL (ref 5–40)

## 2023-06-28 LAB — CMP14+EGFR
ALT: 45 [IU]/L — ABNORMAL HIGH (ref 0–44)
AST: 26 [IU]/L (ref 0–40)
Albumin: 4.8 g/dL (ref 4.1–5.1)
Alkaline Phosphatase: 57 [IU]/L (ref 44–121)
BUN/Creatinine Ratio: 16 (ref 9–20)
BUN: 16 mg/dL (ref 6–20)
Bilirubin Total: 0.5 mg/dL (ref 0.0–1.2)
CO2: 20 mmol/L (ref 20–29)
Calcium: 9.8 mg/dL (ref 8.7–10.2)
Chloride: 100 mmol/L (ref 96–106)
Creatinine, Ser: 1.02 mg/dL (ref 0.76–1.27)
Globulin, Total: 2.7 g/dL (ref 1.5–4.5)
Glucose: 88 mg/dL (ref 70–99)
Potassium: 4.2 mmol/L (ref 3.5–5.2)
Sodium: 138 mmol/L (ref 134–144)
Total Protein: 7.5 g/dL (ref 6.0–8.5)
eGFR: 97 mL/min/{1.73_m2} (ref >59)

## 2023-06-28 LAB — CBC WITH DIFFERENTIAL/PLATELET
Basophils Absolute: 0.1 10*3/uL (ref 0.0–0.2)
Basos: 1 %
EOS (ABSOLUTE): 0.2 10*3/uL (ref 0.0–0.4)
Eos: 3 %
Hematocrit: 47.6 % (ref 37.5–51.0)
Hemoglobin: 15.6 g/dL (ref 13.0–17.7)
Immature Grans (Abs): 0 10*3/uL (ref 0.0–0.1)
Immature Granulocytes: 1 %
Lymphocytes Absolute: 1.9 10*3/uL (ref 0.7–3.1)
Lymphs: 33 %
MCH: 29.2 pg (ref 26.6–33.0)
MCHC: 32.8 g/dL (ref 31.5–35.7)
MCV: 89 fL (ref 79–97)
Monocytes Absolute: 0.7 10*3/uL (ref 0.1–0.9)
Monocytes: 12 %
Neutrophils Absolute: 2.9 10*3/uL (ref 1.4–7.0)
Neutrophils: 50 %
Platelets: 292 10*3/uL (ref 150–450)
RBC: 5.34 x10E6/uL (ref 4.14–5.80)
RDW: 12.8 % (ref 11.6–15.4)
WBC: 5.7 10*3/uL (ref 3.4–10.8)

## 2023-06-28 LAB — TSH+FREE T4
Free T4: 0.94 ng/dL (ref 0.82–1.77)
TSH: 2.73 u[IU]/mL (ref 0.450–4.500)

## 2024-05-15 ENCOUNTER — Encounter: Payer: Self-pay | Admitting: Sports Medicine

## 2024-08-29 ENCOUNTER — Ambulatory Visit: Admitting: Family Medicine

## 2024-08-29 ENCOUNTER — Encounter: Payer: Self-pay | Admitting: Family Medicine

## 2024-08-29 VITALS — BP 131/88 | HR 64 | Ht 74.0 in | Wt 263.0 lb

## 2024-08-29 DIAGNOSIS — R5383 Other fatigue: Secondary | ICD-10-CM

## 2024-08-29 DIAGNOSIS — Z0000 Encounter for general adult medical examination without abnormal findings: Secondary | ICD-10-CM

## 2024-08-29 DIAGNOSIS — F900 Attention-deficit hyperactivity disorder, predominantly inattentive type: Secondary | ICD-10-CM | POA: Diagnosis not present

## 2024-08-29 DIAGNOSIS — Z13220 Encounter for screening for lipoid disorders: Secondary | ICD-10-CM

## 2024-08-29 MED ORDER — AMPHETAMINE-DEXTROAMPHET ER 20 MG PO CP24: 20.0000 mg | ORAL_CAPSULE | Freq: Every day | ORAL | 0 refills | Status: AC

## 2024-08-29 MED ORDER — AMPHETAMINE-DEXTROAMPHET ER 20 MG PO CP24: 20.0000 mg | ORAL_CAPSULE | ORAL | 0 refills | Status: AC

## 2024-08-29 NOTE — Assessment & Plan Note (Signed)
 Well adult Orders Placed This Encounter  Procedures   Testosterone    TSH + free T4   Lipid Panel With LDL/HDL Ratio   CMP14+EGFR   CBC with Differential/Platelet  Screenings: per lab orders Immunizations:  Declines flu vaccine. Anticipatory guidance/Risk factor reduction:  Recommendations per AVS.

## 2024-08-29 NOTE — Assessment & Plan Note (Signed)
 He would like for me to take over his medication for ADHD.  Previous records reviewed and will continue adderall xr 20mg  daily. F/u in 4 months.

## 2024-08-29 NOTE — Patient Instructions (Signed)
 Preventive Care 11-38 Years Old, Male Preventive care refers to lifestyle choices and visits with your health care provider that can promote health and wellness. Preventive care visits are also called wellness exams. What can I expect for my preventive care visit? Counseling During your preventive care visit, your health care provider may ask about your: Medical history, including: Past medical problems. Family medical history. Current health, including: Emotional well-being. Home life and relationship well-being. Sexual activity. Lifestyle, including: Alcohol, nicotine or tobacco, and drug use. Access to firearms. Diet, exercise, and sleep habits. Safety issues such as seatbelt and bike helmet use. Sunscreen use. Work and work Astronomer. Physical exam Your health care provider may check your: Height and weight. These may be used to calculate your BMI (body mass index). BMI is a measurement that tells if you are at a healthy weight. Waist circumference. This measures the distance around your waistline. This measurement also tells if you are at a healthy weight and may help predict your risk of certain diseases, such as type 2 diabetes and high blood pressure. Heart rate and blood pressure. Body temperature. Skin for abnormal spots. What immunizations do I need?  Vaccines are usually given at various ages, according to a schedule. Your health care provider will recommend vaccines for you based on your age, medical history, and lifestyle or other factors, such as travel or where you work. What tests do I need? Screening Your health care provider may recommend screening tests for certain conditions. This may include: Lipid and cholesterol levels. Diabetes screening. This is done by checking your blood sugar (glucose) after you have not eaten for a while (fasting). Hepatitis B test. Hepatitis C test. HIV (human immunodeficiency virus) test. STI (sexually transmitted infection)  testing, if you are at risk. Talk with your health care provider about your test results, treatment options, and if necessary, the need for more tests. Follow these instructions at home: Eating and drinking  Eat a healthy diet that includes fresh fruits and vegetables, whole grains, lean protein, and low-fat dairy products. Drink enough fluid to keep your urine pale yellow. Take vitamin and mineral supplements as recommended by your health care provider. Do not drink alcohol if your health care provider tells you not to drink. If you drink alcohol: Limit how much you have to 0-2 drinks a day. Know how much alcohol is in your drink. In the [place] one drink equals one 12 oz bottle of beer (355 mL), one 5 oz glass of wine (148 mL), or one 1 oz glass of hard liquor (44 mL). Lifestyle Brush your teeth every morning and night with fluoride toothpaste. Floss one time each day. Exercise for at least 30 minutes 5 or more days each week. Do not use any products that contain nicotine or tobacco. These products include cigarettes, chewing tobacco, and vaping devices, such as e-cigarettes. If you need help quitting, ask your health care provider. Do not use drugs. If you are sexually active, practice safe sex. Use a condom or other form of protection to prevent STIs. Find healthy ways to manage stress, such as: Meditation, yoga, or listening to music. Journaling. Talking to a trusted person. Spending time with friends and family. Minimize exposure to UV radiation to reduce your risk of skin cancer. Safety Always wear your seat belt while driving or riding in a vehicle. Do not drive: If you have been drinking alcohol. Do not ride with someone who has been drinking. If you have been using any mind-altering substances  or drugs. While texting. When you are tired or distracted. Wear a helmet and other protective equipment during sports activities. If you have firearms in your house, make sure you  follow all gun safety procedures. Seek help if you have been physically or sexually abused. What's next? Go to your health care provider once a year for an annual wellness visit. Ask your health care provider how often you should have your eyes and teeth checked. Stay up to date on all vaccines. This information is not intended to replace advice given to you by your health care provider. Make sure you discuss any questions you have with your health care provider. Document Revised: 02/25/2021 Document Reviewed: 02/25/2021 Elsevier Patient Education  2024 [employer].

## 2024-08-29 NOTE — Progress Notes (Signed)
 Justin Wang - 38 y.o. male MRN [id number]  Date of birth: 16-Jun-1986  Subjective Chief Complaint  Patient presents with   Annual Exam    HPI Justin Wang is a 38 y.o. male here today for annual exam.   Reports that he is doing pretty well.  Remains on addderall XR.  Currently at 20mg  daily. [facility]  He feels that he continues to do well with this at current strength.   He remains pretty active.  He feels that his diet is pretty good most of the time.   He is non-smoker.  Occasional EtOH use.   Review of Systems  Constitutional:  Negative for chills, fever, malaise/fatigue and weight loss.  HENT:  Negative for congestion, ear pain and sore throat.   Eyes:  Negative for blurred vision, double vision and pain.  Respiratory:  Negative for cough and shortness of breath.   Cardiovascular:  Negative for chest pain and palpitations.  Gastrointestinal:  Negative for abdominal pain, blood in stool, constipation, heartburn and nausea.  Genitourinary:  Negative for dysuria and urgency.  Musculoskeletal:  Negative for joint pain and myalgias.  Neurological:  Negative for dizziness and headaches.  Endo/Heme/Allergies:  Does not bruise/bleed easily.  Psychiatric/Behavioral:  Negative for depression. The patient is not nervous/anxious and does not have insomnia.     Allergies[1]  Past Medical History:  Diagnosis Date   Attention deficit 04/09/2016   Depression 04/09/2016   Vitiligo 11/11/2016    Past Surgical History:  Procedure Laterality Date   ADENOIDECTOMY      Social History   Socioeconomic History   Marital status: Single    Spouse name: Not on file   Number of children: Not on file   Years of education: Not on file   Highest education level: Not on file  Occupational History   Not on file  Tobacco Use   Smoking status: Never   Smokeless tobacco: Never  Substance and Sexual Activity   Alcohol use: Yes    Alcohol/week: 1.0 - 2.0 standard  drink of alcohol    Types: 1 - 2 Cans of beer per week    Comment: weekends   Drug use: No   Sexual activity: Yes    Partners: Female    Birth control/protection: Condom  Other Topics Concern   Not on file  Social History Narrative   Not on file   Social Drivers of Health   Tobacco Use: Low Risk (08/29/2024)   Patient History    Smoking Tobacco Use: Never    Smokeless Tobacco Use: Never    Passive Exposure: Not on file  Financial Resource Strain: Not on file  Food Insecurity: Not on file  Transportation Needs: Not on file  Physical Activity: Medium Risk (06/06/2024)   Received [facility]   Physical Activity    Moderate Physical Activity: Not on file    Vigorous Physical Activity: 4 to 6 days a week    Time Spent Sitting: 6 to 8 hours  Stress: Medium Risk (09/07/2023)   Received from [facility]   Stress    Stress in your Life: Moderate    Dealing with Stress: Moderately effective  Social Connections: Unknown (01/26/2022)   Received from [facility]   Social Network    Social Network: Not on file  Depression [phone]): Low Risk (12/10/2021)   Depression (PHQ2-9)    PHQ-2 Score: 1  Alcohol Screen: Not on file  Housing: Not on file  Utilities:  Not on file  Health Literacy: Not on file    Family History  Problem Relation Age of Onset   Hypertension Mother    Sleep apnea Mother    Ovarian cancer Mother    Diabetes Father    Sleep apnea Father    Hypertension Sister    Sleep apnea Sister    Bipolar disorder Sister    Bipolar disorder Paternal Grandmother     Health Maintenance  Topic Date Due   Hepatitis C Screening  Never done   COVID-19 Vaccine (5 - 2025-26 season) 09/14/2024 (Originally 05/14/2024)   Influenza Vaccine  12/11/2024 (Originally 04/13/2024)   Hepatitis B Vaccines 19-59 Average Risk (3 of 3 - 3-dose series) 08/29/2025 (Originally 02/07/1999)   HPV VACCINES (1 - 3-dose SCDM series) 08/29/2025 (Originally 02/15/2013)   DTaP/Tdap/Td (8 - Td  or Tdap) 11/12/2026   HIV Screening  Completed   Pneumococcal Vaccine  Aged Out   Meningococcal B Vaccine  Aged Out     ----------------------------------------------------------------------------------------------------------------------------------------------------------------------------------------------------------------- Physical Exam BP 131/88 (BP Location: Right Arm, Patient Position: Sitting, Cuff Size: Large)   Pulse 64   Ht 6' 2 (1.88 m)   Wt 263 lb (119.3 kg)   SpO2 96%   BMI 33.77 kg/m   Physical Exam Constitutional:      General: He is not in acute distress. HENT:     Head: Normocephalic and atraumatic.     Right Ear: Tympanic membrane and external ear normal.     Left Ear: Tympanic membrane and external ear normal.  Eyes:     General: No scleral icterus. Neck:     Thyroid: No thyromegaly.  Cardiovascular:     Rate and Rhythm: Normal rate and regular rhythm.     Heart sounds: Normal heart sounds.  Pulmonary:     Effort: Pulmonary effort is normal.     Breath sounds: Normal breath sounds.  Abdominal:     General: Bowel sounds are normal. There is no distension.     Palpations: Abdomen is soft.     Tenderness: There is no abdominal tenderness. There is no guarding.  Musculoskeletal:     Cervical back: Normal range of motion.  Lymphadenopathy:     Cervical: No cervical adenopathy.  Skin:    General: Skin is warm and dry.     Findings: No rash.  Neurological:     Mental Status: He is alert and oriented to person, place, and time.     Cranial Nerves: No cranial nerve deficit.     Motor: No abnormal muscle tone.  Psychiatric:        Mood and Affect: Mood normal.        Behavior: Behavior normal.     ------------------------------------------------------------------------------------------------------------------------------------------------------------------------------------------------------------------- Assessment and Plan  Well adult exam Well  adult Orders Placed This Encounter  Procedures   Testosterone    TSH + free T4   Lipid Panel With LDL/HDL Ratio   CMP14+EGFR   CBC with Differential/Platelet  Screenings: per lab orders Immunizations:  Declines flu vaccine. Anticipatory guidance/Risk factor reduction:  Recommendations per AVS.    ADHD He would like for me to take over his medication for ADHD.  Previous records reviewed and will continue adderall xr 20mg  daily. F/u in 4 months.    Meds ordered this encounter  Medications   amphetamine -dextroamphetamine (ADDERALL XR) 20 MG 24 hr capsule    Sig: Take 1 capsule (20 mg total) by mouth daily.    Dispense:  30 capsule    Refill:  0   amphetamine -dextroamphetamine (ADDERALL XR) 20 MG 24 hr capsule    Sig: Take 1 capsule (20 mg total) by mouth every morning.    Dispense:  30 capsule    Refill:  0   amphetamine -dextroamphetamine (ADDERALL XR) 20 MG 24 hr capsule    Sig: Take 1 capsule (20 mg total) by mouth every morning.    Dispense:  30 capsule    Refill:  0    Return in about 4 months (around 12/28/2024) for ADHD-OK for virtual.        [1]  Allergies Allergen Reactions   Cefaclor

## 2024-08-30 LAB — CBC WITH DIFFERENTIAL/PLATELET
Basophils Absolute: 0.1 x10E3/uL (ref 0.0–0.2)
Basos: 1 %
EOS (ABSOLUTE): 0.2 x10E3/uL (ref 0.0–0.4)
Eos: 3 %
Hematocrit: 44.7 % (ref 37.5–51.0)
Hemoglobin: 14.9 g/dL (ref 13.0–17.7)
Immature Grans (Abs): 0 x10E3/uL (ref 0.0–0.1)
Immature Granulocytes: 0 %
Lymphocytes Absolute: 1.4 x10E3/uL (ref 0.7–3.1)
Lymphs: 25 %
MCH: 30.1 pg (ref 26.6–33.0)
MCHC: 33.3 g/dL (ref 31.5–35.7)
MCV: 90 fL (ref 79–97)
Monocytes Absolute: 0.8 x10E3/uL (ref 0.1–0.9)
Monocytes: 14 %
Neutrophils Absolute: 3.1 x10E3/uL (ref 1.4–7.0)
Neutrophils: 56 %
Platelets: 264 x10E3/uL (ref 150–450)
RBC: 4.95 x10E6/uL (ref 4.14–5.80)
RDW: 13.1 % (ref 11.6–15.4)
WBC: 5.6 x10E3/uL (ref 3.4–10.8)

## 2024-08-30 LAB — CMP14+EGFR
ALT: 46 IU/L — ABNORMAL HIGH (ref 0–44)
AST: 31 IU/L (ref 0–40)
Albumin: 4.6 g/dL (ref 4.1–5.1)
Alkaline Phosphatase: 51 IU/L (ref 47–123)
BUN/Creatinine Ratio: 14 (ref 9–20)
BUN: 14 mg/dL (ref 6–20)
Bilirubin Total: 0.4 mg/dL (ref 0.0–1.2)
CO2: 23 mmol/L (ref 20–29)
Calcium: 9.3 mg/dL (ref 8.7–10.2)
Chloride: 101 mmol/L (ref 96–106)
Creatinine, Ser: 1.01 mg/dL (ref 0.76–1.27)
Globulin, Total: 2.3 g/dL (ref 1.5–4.5)
Glucose: 99 mg/dL (ref 70–99)
Potassium: 4.6 mmol/L (ref 3.5–5.2)
Sodium: 140 mmol/L (ref 134–144)
Total Protein: 6.9 g/dL (ref 6.0–8.5)
eGFR: 98 mL/min/1.73 (ref >59)

## 2024-08-30 LAB — TSH+FREE T4
Free T4: 0.79 ng/dL — ABNORMAL LOW (ref 0.82–1.77)
TSH: 2.82 u[IU]/mL (ref 0.450–4.500)

## 2024-08-30 LAB — LIPID PANEL WITH LDL/HDL RATIO
Cholesterol, Total: 198 mg/dL (ref 100–199)
HDL: 47 mg/dL (ref >39)
LDL Chol Calc (NIH): 130 mg/dL — ABNORMAL HIGH (ref 0–99)
LDL/HDL Ratio: 2.8 ratio (ref 0.0–3.6)
Triglycerides: 115 mg/dL (ref 0–149)
VLDL Cholesterol Cal: 21 mg/dL (ref 5–40)

## 2024-08-30 LAB — TESTOSTERONE: Testosterone: 463 ng/dL (ref 264–916)

## 2024-09-06 ENCOUNTER — Ambulatory Visit: Payer: Self-pay | Admitting: Family Medicine

## 2024-12-28 ENCOUNTER — Ambulatory Visit: Admitting: Family Medicine
